# Patient Record
Sex: Male | Born: 1990 | Race: White | Hispanic: No | Marital: Single | Smoking: Current some day smoker
Health system: Southern US, Community
[De-identification: ages and names within clinical notes are randomized; demographics above are authoritative.]

## PROBLEM LIST (undated history)

## (undated) DIAGNOSIS — F1110 Opioid abuse, uncomplicated: Secondary | ICD-10-CM

## (undated) DIAGNOSIS — F419 Anxiety disorder, unspecified: Secondary | ICD-10-CM

## (undated) DIAGNOSIS — G43909 Migraine, unspecified, not intractable, without status migrainosus: Secondary | ICD-10-CM

## (undated) HISTORY — PX: OTHER SURGICAL HISTORY: SHX169

---
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
## 2014-01-20 ENCOUNTER — Emergency Department: Payer: Self-pay | Admitting: Emergency Medicine

## 2014-04-01 ENCOUNTER — Emergency Department: Payer: Self-pay | Admitting: Emergency Medicine

## 2014-04-01 LAB — BASIC METABOLIC PANEL
ANION GAP: 7 (ref 7–16)
BUN: 9 mg/dL (ref 7–18)
CREATININE: 0.79 mg/dL (ref 0.60–1.30)
Calcium, Total: 8.9 mg/dL (ref 8.5–10.1)
Chloride: 109 mmol/L — ABNORMAL HIGH (ref 98–107)
Co2: 28 mmol/L (ref 21–32)
EGFR (African American): >60
EGFR (Non-African Amer.): >60
Glucose: 90 mg/dL (ref 65–99)
Osmolality: 285 (ref 275–301)
Potassium: 4.3 mmol/L (ref 3.5–5.1)
Sodium: 144 mmol/L (ref 136–145)

## 2014-04-01 LAB — CBC
HCT: 39.9 % — AB (ref 40.0–52.0)
HGB: 13 g/dL (ref 13.0–18.0)
MCH: 25.8 pg — ABNORMAL LOW (ref 26.0–34.0)
MCHC: 32.5 g/dL (ref 32.0–36.0)
MCV: 79 fL — ABNORMAL LOW (ref 80–100)
PLATELETS: 164 10*3/uL (ref 150–440)
RBC: 5.02 10*6/uL (ref 4.40–5.90)
RDW: 17.8 % — ABNORMAL HIGH (ref 11.5–14.5)
WBC: 5.7 10*3/uL (ref 3.8–10.6)

## 2014-10-21 ENCOUNTER — Emergency Department: Payer: Self-pay | Admitting: Emergency Medicine

## 2015-01-29 ENCOUNTER — Emergency Department
Admission: EM | Admit: 2015-01-29 | Discharge: 2015-01-29 | Discharge status: Against Medical Advice | Payer: 59 | Attending: Student | Admitting: Student

## 2015-01-29 DIAGNOSIS — F1210 Cannabis abuse, uncomplicated: Secondary | ICD-10-CM | POA: Diagnosis not present

## 2015-01-29 DIAGNOSIS — F1110 Opioid abuse, uncomplicated: Secondary | ICD-10-CM

## 2015-01-29 DIAGNOSIS — Z720 Tobacco use: Secondary | ICD-10-CM | POA: Insufficient documentation

## 2015-01-29 DIAGNOSIS — F1310 Sedative, hypnotic or anxiolytic abuse, uncomplicated: Secondary | ICD-10-CM | POA: Diagnosis not present

## 2015-01-29 HISTORY — DX: Opioid abuse, uncomplicated: F11.10

## 2015-01-29 HISTORY — DX: Migraine, unspecified, not intractable, without status migrainosus: G43.909

## 2015-01-29 HISTORY — DX: Anxiety disorder, unspecified: F41.9

## 2015-01-29 LAB — CBC
HCT: 41.8 % (ref 40.0–52.0)
Hemoglobin: 13.8 g/dL (ref 13.0–18.0)
MCH: 25.5 pg — ABNORMAL LOW (ref 26.0–34.0)
MCHC: 32.9 g/dL (ref 32.0–36.0)
MCV: 77.6 fL — ABNORMAL LOW (ref 80.0–100.0)
Platelets: 200 10*3/uL (ref 150–440)
RBC: 5.39 MIL/uL (ref 4.40–5.90)
RDW: 16.2 % — ABNORMAL HIGH (ref 11.5–14.5)
WBC: 6.7 10*3/uL (ref 3.8–10.6)

## 2015-01-29 LAB — SALICYLATE LEVEL

## 2015-01-29 LAB — COMPREHENSIVE METABOLIC PANEL
ALBUMIN: 4.5 g/dL (ref 3.5–5.0)
ALK PHOS: 121 U/L (ref 38–126)
ALT: 22 U/L (ref 17–63)
ANION GAP: 8 (ref 5–15)
AST: 25 U/L (ref 15–41)
BUN: 7 mg/dL (ref 6–20)
CO2: 29 mmol/L (ref 22–32)
CREATININE: 0.85 mg/dL (ref 0.61–1.24)
Calcium: 9.5 mg/dL (ref 8.9–10.3)
Chloride: 105 mmol/L (ref 101–111)
GFR calc non Af Amer: >60 mL/min (ref >60)
GLUCOSE: 67 mg/dL (ref 65–99)
POTASSIUM: 3.7 mmol/L (ref 3.5–5.1)
Sodium: 142 mmol/L (ref 135–145)
Total Bilirubin: 0.9 mg/dL (ref 0.3–1.2)
Total Protein: 8 g/dL (ref 6.5–8.1)

## 2015-01-29 LAB — ACETAMINOPHEN LEVEL: Acetaminophen (Tylenol), Serum: <10 ug/mL — ABNORMAL LOW (ref 10–30)

## 2015-01-29 LAB — URINE DRUG SCREEN, QUALITATIVE (ARMC ONLY)
Amphetamines, Ur Screen: NOT DETECTED — AB
Barbiturates, Ur Screen: NOT DETECTED — AB
Benzodiazepine, Ur Scrn: POSITIVE — AB
CANNABINOID 50 NG, UR ~~LOC~~: POSITIVE — AB
COCAINE METABOLITE, UR ~~LOC~~: NOT DETECTED — AB
MDMA (ECSTASY) UR SCREEN: NOT DETECTED — AB
METHADONE SCREEN, URINE: NOT DETECTED — AB
Opiate, Ur Screen: NOT DETECTED — AB
PHENCYCLIDINE (PCP) UR S: NOT DETECTED — AB
Tricyclic, Ur Screen: NOT DETECTED — AB

## 2015-01-29 LAB — ETHANOL: Alcohol, Ethyl (B): <5 mg/dL (ref <5)

## 2015-01-29 NOTE — ED Notes (Signed)
Pt states that he is here voluntarily because he wants detox from "pain killers". Pt currenlty takes percocet, dilaudid, vicodin and promethazine with codeine daily. Pt states use x1 year; has gotten worse and use has been daily since February when his grandmother died. Denies SI or HI. Alert and oriented X4, cooperative, pt in NAD. History of detox at home for "a few days at a time".

## 2015-01-29 NOTE — ED Notes (Signed)
Pt states he wants help to stop using opiate/pain meds..states he uses everyday.states last used 2 days ago.

## 2015-01-29 NOTE — ED Notes (Signed)

## 2015-01-29 NOTE — ED Notes (Signed)
Pt dressed out in triage.

## 2015-01-29 NOTE — Discharge Instructions (Signed)
Chemical Dependency °Chemical dependency is an addiction to drugs or alcohol. It is characterized by the repeated behavior of seeking out and using drugs and alcohol despite harmful consequences to the health and safety of ones self and others.  °RISK FACTORS °There are certain situations or behaviors that increase a person's risk for chemical dependency. These include: °· A family history of chemical dependency. °· A history of mental health issues, including depression and anxiety. °· A home environment where drugs and alcohol are easily available to you. °· Drug or alcohol use at a young age. °SYMPTOMS  °The following symptoms can indicate chemical dependency: °· Inability to limit the use of drugs or alcohol. °· Nausea, sweating, shakiness, and anxiety that occurs when alcohol or drugs are not being used. °· An increase in amount of drugs or alcohol that is necessary to get drunk or high. °People who experience these symptoms can assess their use of drugs and alcohol by asking themselves the following questions: °· Have you been told by friends or family that they are worried about your use of alcohol or drugs? °· Do friends and family ever tell you about things you did while drinking alcohol or using drugs that you do not remember? °· Do you lie about using alcohol or drugs or about the amounts you use? °· Do you have difficulty completing daily tasks unless you use alcohol or drugs? °· Is the level of your work or school performance lower because of your drug or alcohol use? °· Do you get sick from using drugs or alcohol but keep using anyway? °· Do you feel uncomfortable in social situations unless you use alcohol or drugs? °· Do you use drugs or alcohol to help forget problems?  °An answer of yes to any of these questions may indicate chemical dependency. Professional evaluation is suggested. °Document Released: 09/05/2001 Document Revised: 12/04/2011 Document Reviewed: 11/17/2010 °ExitCare® Patient  Information ©2015 [employer]. This information is not intended to replace advice given to you by your health care provider. Make sure you discuss any questions you have with your health care provider. ° °Opioid Use Disorder °Opioid use disorder is a mental disorder. It is the continued nonmedical use of opioids in spite of risks to health and well-being. Misused opioids include the street drug heroin. They also include pain medicines such as morphine, hydrocodone, oxycodone, and fentanyl. Opioids are very addictive. People who misuse opioids get an exaggerated feeling of well-being. Opioid use disorder often disrupts activities at home, work, or school. It may cause mental or physical problems.  °A family history of opioid use disorder puts you at higher risk of it. People with opioid use disorder often misuse other drugs or have mental illness such as depression, posttraumatic stress disorder, or antisocial personality disorder. They also are at risk of suicide and death from overdose. °SIGNS AND SYMPTOMS  °Signs and symptoms of opioid use disorder include: °· Use of opioids in larger amounts or over a longer period than intended. °· Unsuccessful attempts to cut down or control opioid use. °· A lot of time spent obtaining, using, or recovering from the effects of opioids. °· A strong desire or urge to use opioids (craving). °· Continued use of opioids in spite of major problems at work, school, or home because of use. °· Continued use of opioids in spite of relationship problems because of use. °· Giving up or cutting down on important life activities because of opioid use. °· Use of opioids over and over   in situations when it is physically hazardous, such as driving a car.  Continued use of opioids in spite of a physical problem that is likely related to use. Physical problems can include:  Severe constipation.  Poor nutrition.  Infertility.  Tuberculosis.  Aspiration pneumonia.  Infections such  as human immunodeficiency virus (HIV) and hepatitis (from injecting opioids).  Continued use of opioids in spite of a mental problem that is likely related to use. Mental problems can include:  Depression.  Anxiety.  Hallucinations.  Sleep problems.  Loss of sexual function.  Need to use more and more opioids to get the same effect, or lessened effect over time with use of the same amount (tolerance).  Having withdrawal symptoms when opioid use is stopped, or using opioids to reduce or avoid withdrawal symptoms. Withdrawal symptoms include:  Depressed, anxious, or irritable mood.  Nausea, vomiting, diarrhea, or intestinal cramping.  Muscle aches or spasms.  Excessive tearing or runny nose.  Dilated pupils, sweating, or hairs standing on end.  Yawning.  Fever, raised blood pressure, or fast pulse.  Restlessness or trouble sleeping. This does not apply to people taking opioids for medical reasons only. DIAGNOSIS Opioid use disorder is diagnosed by your health care provider. You may be asked questions about your opioid use and and how it affects your life. A physical exam may be done. A drug screen may be ordered. You may be referred to a mental health professional. The diagnosis of opioid use disorder requires at least two symptoms within 12 months. The type of opioid use disorder you have depends on the number of signs and symptoms you have. The type may be:  Mild. Two or three signs and symptoms.   Moderate. Four or five signs and symptoms.   Severe. Six or more signs and symptoms. TREATMENT  Treatment is usually provided by mental health professionals with training in substance use disorders.The following options are available:  Detoxification.This is the first step in treatment for withdrawal. It is medically supervised withdrawal with the use of medicines. These medicines lessen withdrawal symptoms. They also raise the chance of becoming opioid free.  Counseling,  also known as talk therapy. Talk therapy addresses the reasons you use opioids. It also addresses ways to keep you from using again (relapse). The goals of talk therapy are to avoid relapse by:  Identifying and avoiding triggers for use.  Finding healthy ways to cope with stress.  Learning how to handle cravings.  Support groups. Support groups provide emotional support, advice, and guidance.  A medicine that blocks opioid receptors in your brain. This medicine can reduce opioid cravings that lead to relapse. This medicine also blocks the desired opioid effect when relapse occurs.  Opioids that are taken by mouth in place of the misused opioid (opioid maintenance treatment). These medicines satisfy cravings but are safer than commonly misused opioids. This often is the best option for people who continue to relapse with other treatments. HOME CARE INSTRUCTIONS   Take medicines only as directed by your health care provider.  Check with your health care provider before starting new medicines.  Keep all follow-up visits as directed by your health care provider. SEEK MEDICAL CARE IF:  You are not able to take your medicines as directed.  Your symptoms get worse. SEEK IMMEDIATE MEDICAL CARE IF:  You have serious thoughts about hurting yourself or others.  You may have taken an overdose of opioids. FOR MORE INFORMATION  National Institute on Drug Abuse: www.drugabuse.gov  Substance Abuse and Mental Health Services Administration: [employer].ptwww.samhsa.gov Document Released: 07/09/2007 Document Revised: 01/26/2014 Document Reviewed: 09/24/2013 [facility] Patient Information 2015 [place], [place]. This information is not intended to replace advice given to you by your health care provider. Make sure you discuss any questions you have with your health care provider.

## 2015-01-29 NOTE — ED Notes (Signed)
BEHAVIORAL HEALTH ROUNDING Patient sleeping: No. Patient alert and oriented: yes Behavior appropriate: Yes.  ; If no, describe:  Nutrition and fluids offered: Yes  Toileting and hygiene offered: Yes  Sitter present: no Law enforcement present: Yes  

## 2015-01-29 NOTE — ED Provider Notes (Signed)
[facility] Emergency Department Provider Note    ____________________________________________  Time seen: ----------------------------------------- 4:08 PM on 01/29/2015 -----------------------------------------    I have reviewed the triage vital signs and the nursing notes.   HISTORY  Chief Complaint Addiction Problem       HPI Hazle CocaJacob K Beste is a 24 y.o. male chin anxiety and migraines presents for evaluation for opiate detox. Location generalized. Duration one year, symptoms worsened approximately 2 months ago when his grandmother died. Timing waxing and waning. Current severity 10 out of 10. Patient abuses Percocet, Dilaudid, Vicodin, promethazine with codeine. No acute medical complaints and he has otherwise been in his usual state of health. No recent changes in medications, no recent surgeries.     Past Medical History  Diagnosis Date  . Anxiety   . Migraines   . Opiate abuse, continuous     There are no active problems to display for this patient.   Past Surgical History  Procedure Laterality Date  . Denies      No current outpatient prescriptions on file.  Allergies Review of patient's allergies indicates no known allergies.  No family history on file.  Social History History  Substance Use Topics  . Smoking status: Current Some Day Smoker    Types: Cigarettes  . Smokeless tobacco: Never Used  . Alcohol Use: No    Review of Systems  Constitutional: Negative for fever. Eyes: Negative for visual changes. ENT: Negative for sore throat. Cardiovascular: Negative for chest pain. Respiratory: Negative for shortness of breath. Gastrointestinal: Negative for abdominal pain, vomiting and diarrhea. Genitourinary: Negative for dysuria. Musculoskeletal: Negative for back pain. Skin: Negative for rash. Neurological: Negative for headaches, focal weakness or numbness. Psychiatric:Negative for suicidal ideation, homicidal  ideation, auditory or visual hallucinations  10-point ROS otherwise negative.  ____________________________________________   PHYSICAL EXAM:  VITAL SIGNS: ED Triage Vitals  Enc Vitals Group     BP 01/29/15 1505 113/84 mmHg     Pulse Rate 01/29/15 1505 78     Resp 01/29/15 1505 15     Temp 01/29/15 1505 97.8 F (36.6 C)     Temp Source 01/29/15 1505 Oral     SpO2 01/29/15 1505 99 %     Weight 01/29/15 1505 140 lb (63.504 kg)     Height 01/29/15 1505 5\' 7"  (1.702 m)     Head Cir --      Peak Flow --      Pain Score --      Pain Loc --      Pain Edu? --      Excl. in GC? --      Constitutional: Alert and oriented. Well appearing and in no distress. Eyes: Conjunctivae are normal. PERRL. Normal extraocular movements. ENT   Head: Normocephalic and atraumatic.   Nose: No congestion/rhinnorhea.   Mouth/Throat: Mucous membranes are moist.   Neck: No stridor. Hematological/Lymphatic/Immunilogical: No cervical lymphadenopathy. Cardiovascular: Normal rate, regular rhythm. Normal and symmetric distal pulses are present in all extremities. No murmurs, rubs, or gallops. Respiratory: Normal respiratory effort without tachypnea nor retractions. Breath sounds are clear and equal bilaterally. No wheezes/rales/rhonchi. Gastrointestinal: Soft and nontender. No distention. No abdominal bruits. There is no CVA tenderness. Genitourinary: deferred Musculoskeletal: Nontender with normal range of motion in all extremities. No joint effusions.  No lower extremity tenderness nor edema. Neurologic:  Normal speech and language. No gross focal neurologic deficits are appreciated. Speech is normal. No gait instability. Skin:  Skin is warm, dry and  intact. No rash noted. Psychiatric: Mood and affect are normal. Speech and behavior are normal. Patient exhibits appropriate insight and judgment.  ____________________________________________    LABS (pertinent positives/negatives)  Pending  at time of discharge. Reviewed by me after he left and were generally unremarkable.  ____________________________________________   EKG  None  ____________________________________________    RADIOLOGY  None  ____________________________________________   PROCEDURES  Procedure(s) performed: None  Critical Care performed: No  ____________________________________________   INITIAL IMPRESSION / ASSESSMENT AND PLAN / ED COURSE  Pertinent labs & imaging results that were available during my care of the patient were reviewed by me and considered in my medical decision making (see chart for details).  Hazle CocaJacob K Goetzinger is a 24 y.o. male chin anxiety and migraines presents for evaluation for opiate detox. And on exam. No acute medical complaints. Vital signs stable. Plan for screening labs and behavioral consult.   ----------------------------------------- 4:38 PM on 01/29/2015 -----------------------------------------  Patient reports that he needs to leave immediately. He reports that if he doesn't make it to work on time, he will lose his job today. There is no reason to involuntarily commit him/retain him here in the emergency department. Encouraged him to stay in the ER because his lab work is pending and there could be a critical electrolyte abnormality. He refuses to stay, is leaving  AGAINST MEDICAL ADVICE. I will provide discharge papers. He appears well, no evidence of active/severe withdrawal.  ____________________________________________   FINAL CLINICAL IMPRESSION(S) / ED DIAGNOSES  Final diagnoses:  Opiate abuse, continuous     Gayla DossEryka A Cortlan Dolin, MD 01/29/15 2317

## 2015-01-29 NOTE — ED Notes (Signed)
Pt encouraged to stay for further exam; pt refuses. EDP informed. AMA form signed. Pt left to go to work. Alert and oriented X4, cooperative.

## 2015-01-29 NOTE — ED Notes (Signed)
Pt states that he needs to leave; cannot miss work at 5

## 2015-01-29 NOTE — ED Notes (Signed)
MD at bedside. 

## 2015-02-05 DIAGNOSIS — Z720 Tobacco use: Secondary | ICD-10-CM | POA: Diagnosis not present

## 2015-02-05 DIAGNOSIS — Y92481 Parking lot as the place of occurrence of the external cause: Secondary | ICD-10-CM | POA: Diagnosis not present

## 2015-02-05 DIAGNOSIS — Y9389 Activity, other specified: Secondary | ICD-10-CM | POA: Insufficient documentation

## 2015-02-05 DIAGNOSIS — T1592XA Foreign body on external eye, part unspecified, left eye, initial encounter: Secondary | ICD-10-CM | POA: Insufficient documentation

## 2015-02-05 DIAGNOSIS — Y990 Civilian activity done for income or pay: Secondary | ICD-10-CM | POA: Diagnosis not present

## 2015-02-05 DIAGNOSIS — X58XXXA Exposure to other specified factors, initial encounter: Secondary | ICD-10-CM | POA: Insufficient documentation

## 2015-02-05 DIAGNOSIS — S0592XA Unspecified injury of left eye and orbit, initial encounter: Secondary | ICD-10-CM | POA: Diagnosis present

## 2015-02-05 NOTE — ED Notes (Signed)
Pt states since he put contact in left eye has had pain but states it feels like its stuck under top eye lid.

## 2015-02-06 ENCOUNTER — Emergency Department
Admission: EM | Admit: 2015-02-06 | Discharge: 2015-02-06 | Disposition: A | Discharge status: Home / Self Care | Payer: 59 | Attending: Emergency Medicine | Admitting: Emergency Medicine

## 2015-02-06 DIAGNOSIS — T1592XA Foreign body on external eye, part unspecified, left eye, initial encounter: Secondary | ICD-10-CM

## 2015-02-06 MED ORDER — ERYTHROMYCIN 5 MG/GM OP OINT
TOPICAL_OINTMENT | OPHTHALMIC | Status: AC
  Administered 2015-02-06: 1 via OPHTHALMIC
  Filled 2015-02-06: qty 1

## 2015-02-06 MED ORDER — ERYTHROMYCIN 5 MG/GM OP OINT
TOPICAL_OINTMENT | Freq: Once | OPHTHALMIC | Status: AC
  Administered 2015-02-06: 1 via OPHTHALMIC

## 2015-02-06 NOTE — ED Provider Notes (Signed)
[facility] Provider Note  ____________________________________________  Time seen: Approximately 1:40 AM  I have reviewed the triage vital signs and the nursing notes.   HISTORY  Chief Complaint Eye Pain    HPI Jeremy Owen is a 24 y.o. male comes in today with foreign body in his left eye. The patient reports that he was sitting in the parking lot at his job putting in his contact when it got stuck in the upper portion of his eye. The patient reports that he attempted to pull the contact out but was unsuccessful. The patient reports that he told his job he would come into the hospital and have them adjusted in the right back. The patient reports that while he was in the waiting room he continually went to the bathroom as his eye was very irritated and at one point his eye felt improved. He reports that he did not see the contact himself but feels that it may have fallen out of his eye. The patient denies any blurry vision any significant pain any drainage or erythema. The patient reports that his eye feels funny but not as it did previously. He reports that he did put some "stuff" in his eye for lubrication.   Past Medical History  Diagnosis Date  . Anxiety   . Migraines   . Opiate abuse, continuous     There are no active problems to display for this patient.   Past Surgical History  Procedure Laterality Date  . Denies      No current outpatient prescriptions on file.  Allergies Review of patient's allergies indicates no known allergies.  No family history on file.  Social History History  Substance Use Topics  . Smoking status: Current Some Day Smoker    Types: Cigarettes  . Smokeless tobacco: Never Used  . Alcohol Use: No    Review of Systems Constitutional: No fever/chills Eyes: No visual changes. ENT: No sore throat. Cardiovascular: Denies chest pain. Respiratory: Denies shortness of  breath. Gastrointestinal: No abdominal pain.  No nausea, no vomiting.   Genitourinary: Negative for dysuria. Musculoskeletal: Negative for back pain. Skin: Negative for rash. Neurological: Negative for headaches, 10-point ROS otherwise negative.  ____________________________________________   PHYSICAL EXAM:  VITAL SIGNS: ED Triage Vitals  Enc Vitals Group     BP 02/05/15 2203 126/83 mmHg     Pulse Rate 02/05/15 2203 86     Resp 02/05/15 2203 18     Temp 02/05/15 2203 97.8 F (36.6 C)     Temp Source 02/05/15 2203 Oral     SpO2 02/05/15 2203 100 %     Weight 02/05/15 2203 135 lb (61.236 kg)     Height 02/05/15 2203 5\' 8"  (1.727 m)     Head Cir --      Peak Flow --      Pain Score 02/05/15 2204 6     Pain Loc --      Pain Edu? --      Excl. in GC? --     Constitutional: Alert and oriented. Well appearing and in no acute distress. Eyes: Conjunctivae are normal. PERRL. EOMI. contact not visualized in left eye Head: Atraumatic. Nose: No congestion/rhinnorhea. Mouth/Throat: Mucous membranes are moist.  Oropharynx non-erythematous. Cardiovascular: Normal rate, regular rhythm. Grossly normal heart sounds.  Good peripheral circulation. Respiratory: Normal respiratory effort.  No retractions. Lungs CTAB. Gastrointestinal: Soft and nontender. No distention.  Genitourinary: Deferred Musculoskeletal: No lower extremity tenderness nor edema.  Neurologic:  Normal speech and language.  Skin:  Skin is warm, dry and intact. Psychiatric: Mood and affect are normal. Speech and behavior are normal.  ____________________________________________   LABS (all labs ordered are listed, but only abnormal results are displayed)  Labs Reviewed - No data to display ____________________________________________  EKG  None ____________________________________________  RADIOLOGY  None ____________________________________________   PROCEDURES  Procedure(s) performed: None  Critical  Care performed: No  ____________________________________________   INITIAL IMPRESSION / ASSESSMENT AND PLAN / ED COURSE  Pertinent labs & imaging results that were available during my care of the patient were reviewed by me and considered in my medical decision making (see chart for details).  The patient is a 24 year old male who comes in with a foreign body in his left eye. The foreign body as the patient's contact lens. Upon evaluation the contact lens is not visualized. The patient does have some mild erythema of his sclera. It appears as though the patient's eye is irritated from the manipulation attempting to remove the contact. It appears as though the patient may have removed the contact on his own while continually checking in the bathroom. I will give the patient some erythromycin eye ointment and discharge him to follow-up with ophthalmology. The patient understands the plan and agrees with the follow-up as previously stated ____________________________________________   FINAL CLINICAL IMPRESSION(S) / ED DIAGNOSES  Final diagnoses:  Left eye foreign body       Rebecka ApleyAllison P Joon Pohle, MD 02/06/15 0210

## 2015-02-06 NOTE — ED Notes (Signed)
Pt reports he thought contact was stuck in his left upper eyelid but now states he feels it has gone back to normal.  Pt reports still feeling the eye is irritated.  Pt states able to see out of eye though blurry which is baseline.  Pt NAD at this time.

## 2015-02-06 NOTE — Discharge Instructions (Signed)
Eye, Foreign Body The term foreign body refers to any object near, on the surface of or in the eye that should not be there. A foreign body may be a small speck of dirt or dust, a hair or eyelash, a splinter or any object. CAUSES  Foreign bodies can get in the eye by:  Flying pieces of something that was broken or destroyed (debris).  A sudden injury (trauma) to the eye. SYMPTOMS  Symptoms depend on what the foreign body is and where it is in the eye. The most common locations are:  On the inner surface of the upper or lower eyelids or on the covering of the white part of the eye (conjunctiva). Symptoms in this location are:  Irritating and painful, especially when blinking.  Feeling like something is in the eye.  On the surface of the clear covering on the front of the eye (cornea). A corneal foreign body has symptoms that:  Are painful and irritating since the cornea is very sensitive.  Form small "rust rings" around a metallic foreign body. Metallic foreign bodies stick more firmly to the surface of the cornea.  Inside the eyeball. Infection can happen fast and can be hard to treat with antibiotics. This is an extremely dangerous situation. Foreign bodies inside the eye can threaten vision. A person may even loose their eye. Foreign bodies inside the eye may cause:  Great pain.  Immediate loss of vision. DIAGNOSIS  Foreign bodies are found during an exam by an eye specialist. Those that are on the eyelids, conjunctiva or cornea are usually (but not always) easily found. When a foreign body is inside the eyeball, a cataract may form almost right away. This makes it hard for an ophthalmologist to find the foreign body. Special tests may be needed, including ultrasound testing, X-rays and CT scans. TREATMENT   Foreign bodies that are on the eyelids, conjunctiva or cornea are often removed easily and painlessly.  If the foreign body has caused a scratch or abrasion of the cornea,  antibiotic drops, ointments and/or a tight patch called a "pressure patch" may be needed. Follow-up exams will be needed for several days until the abrasion heals.  Surgery is needed right away if the foreign body is inside the eyeball. This is a medical emergency. An antibiotic therapy will likely be given to stop an infection. HOME CARE INSTRUCTIONS  The use of eye patches is not universal. Their use varies from state to state and from caregiver to caregiver. If an eye patch was applied:  Keep the eye patch on for as long as directed by your caregiver until the follow-up appointment.  Do not remove the patch to put in medications unless instructed to do so. When replacing the patch, retape it as it was before. Follow the same procedure if the patch becomes loose.  WARNING: Do not drive or operate machinery while the eye is patched. The ability to judge distances will be impaired.  Only take over-the-counter or prescription medicines for pain, discomfort or fever as directed by the caregiver. If no eye patch was applied:  Keep the eye closed as much as possible. Do not rub the eye.  Wear dark glasses as needed to protect the eyes from bright light.  Do not wear contact lenses until the eye feels normal again, or as instructed.  Wear protective eye covering if there is a risk of eye injury. This is important when working with high speed tools.  Only take over-the-counter or   prescription medicines for pain, discomfort or fever as directed by the caregiver. SEEK IMMEDIATE MEDICAL CARE IF:   Pain increases in the eye or the vision changes.  You or your child has problems with the eye patch.  The injury to the eye appears to be getting larger.  There is discharge from the injured eye.  Swelling and/or soreness (inflammation) develops around the affected eye.  You or your child has an oral temperature above 102 F (38.9 C), not controlled by medicine.  Your baby is older than 3  months with a rectal temperature of 102 F (38.9 C) or higher.  Your baby is 3 months old or younger with a rectal temperature of 100.4 F (38 C) or higher. MAKE SURE YOU:   Understand these instructions.  Will watch your condition.  Will get help right away if you are not doing well or get worse. Document Released: 09/11/2005 Document Revised: 12/04/2011 Document Reviewed: 02/06/2013 ExitCare Patient Information 2015 [employer]. This information is not intended to replace advice given to you by your health care provider. Make sure you discuss any questions you have with your health care provider.  

## 2015-07-06 ENCOUNTER — Encounter (HOSPITAL_COMMUNITY): Payer: Self-pay | Admitting: *Deleted

## 2015-07-06 ENCOUNTER — Emergency Department (HOSPITAL_COMMUNITY)
Admission: EM | Admit: 2015-07-06 | Discharge: 2015-07-06 | Discharge status: Against Medical Advice | Payer: Commercial Managed Care - HMO | Attending: Emergency Medicine | Admitting: Emergency Medicine

## 2015-07-06 ENCOUNTER — Emergency Department (HOSPITAL_COMMUNITY): Payer: Commercial Managed Care - HMO

## 2015-07-06 DIAGNOSIS — S6991XA Unspecified injury of right wrist, hand and finger(s), initial encounter: Secondary | ICD-10-CM | POA: Diagnosis not present

## 2015-07-06 DIAGNOSIS — Y9389 Activity, other specified: Secondary | ICD-10-CM | POA: Diagnosis not present

## 2015-07-06 DIAGNOSIS — F419 Anxiety disorder, unspecified: Secondary | ICD-10-CM | POA: Diagnosis not present

## 2015-07-06 DIAGNOSIS — M25559 Pain in unspecified hip: Secondary | ICD-10-CM

## 2015-07-06 DIAGNOSIS — S79911A Unspecified injury of right hip, initial encounter: Secondary | ICD-10-CM | POA: Diagnosis not present

## 2015-07-06 DIAGNOSIS — Y998 Other external cause status: Secondary | ICD-10-CM | POA: Diagnosis not present

## 2015-07-06 DIAGNOSIS — Y92810 Car as the place of occurrence of the external cause: Secondary | ICD-10-CM | POA: Insufficient documentation

## 2015-07-06 DIAGNOSIS — Z720 Tobacco use: Secondary | ICD-10-CM | POA: Diagnosis not present

## 2015-07-06 DIAGNOSIS — Z8679 Personal history of other diseases of the circulatory system: Secondary | ICD-10-CM | POA: Diagnosis not present

## 2015-07-06 MED ORDER — ACETAMINOPHEN 325 MG PO TABS
650.0000 mg | ORAL_TABLET | Freq: Once | ORAL | Status: DC
  Filled 2015-07-06: qty 2
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
## 2015-07-06 NOTE — ED Notes (Signed)
Pt refused tylenol stated he wanted narcotics, because he has to work, pt advised that he can't work and take narcotics and per ED MD because of his substance abuse that he is getting a non narcotic medication to help with pain and his sobriety.  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   [employer] and security in lobby speaking with pt after he left AMA because he couldn't get narcotis
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
## 2016-10-04 ENCOUNTER — Emergency Department (HOSPITAL_COMMUNITY)
Admission: EM | Admit: 2016-10-04 | Discharge: 2016-10-04 | Disposition: A | Discharge status: Home / Self Care | Payer: Commercial Managed Care - HMO | Attending: Physician Assistant | Admitting: Physician Assistant

## 2016-10-04 ENCOUNTER — Encounter (HOSPITAL_COMMUNITY): Payer: Self-pay | Admitting: Emergency Medicine

## 2016-10-04 DIAGNOSIS — Z765 Malingerer [conscious simulation]: Secondary | ICD-10-CM

## 2016-10-04 DIAGNOSIS — K0889 Other specified disorders of teeth and supporting structures: Secondary | ICD-10-CM

## 2016-10-04 DIAGNOSIS — F17210 Nicotine dependence, cigarettes, uncomplicated: Secondary | ICD-10-CM | POA: Diagnosis not present

## 2016-10-04 MED ORDER — PENICILLIN V POTASSIUM 500 MG PO TABS: 500.0000 mg | ORAL_TABLET | Freq: Four times a day (QID) | ORAL | 0 refills | Status: AC

## 2016-10-04 NOTE — Discharge Instructions (Signed)
Please read attached information. If you experience any new or worsening signs or symptoms please return to the emergency room for evaluation. Please follow-up with your primary care provider or specialist as discussed.  °

## 2016-10-04 NOTE — ED Notes (Signed)
Went to discharge patient and patient was not in room. His patient labels were still in the room at bedside. Called for patient in lobby but no answer. Spoke with Trey PaulaJeff, [place] about patient departing before discharging patient.

## 2016-10-04 NOTE — ED Provider Notes (Signed)
[facility]EM[facility]ENC[facility] Provider Note   CSN: [id number] Arrival date & time: 10/04/16  1354  By signing my name below, I, Jeremy Owen, attest that this documentation has been prepared under the direction and in the presence of [employer].  Electronically Signed: Vista Owen, ED Scribe. 10/04/16. 3:53 PM.  History   Chief Complaint Chief Complaint  Patient presents with  . Dental Pain    HPI HPI Comments: Jeremy Owen is a 26 y.o. male who presents to the Emergency Department complaining of worsening left sided dental pain that started that started several months ago. He states that his has two decayed teeth on left upper and left lower. He reports worst pain to his jaw and some swelling noted Pt states that he is having them pulled next week by a dentist. He has taken ibuprofen and tylenol with no relief of symptoms. No ear pain.  The history is provided by the patient. No language interpreter was used.   Past Medical History:  Diagnosis Date  . Anxiety   . Migraines   . Opiate abuse, continuous    There are no active problems to display for this patient.  Past Surgical History:  Procedure Laterality Date  . denies       Home Medications    Prior to Admission medications   Medication Sig Start Date End Date Taking? Authorizing Provider  LORazepam (ATIVAN) 0.5 MG tablet Take 0.5 mg by mouth 2 (two) times daily as needed for anxiety.  05/06/15   Historical Provider, MD  penicillin v potassium (VEETID) 500 MG tablet Take 1 tablet (500 mg total) by mouth 4 (four) times daily. 10/04/16 10/11/16  Eyvonne Mechanic, PA-C   Family History No family history on file.  Social History Social History  Substance Use Topics  . Smoking status: Current Some Day Smoker    Types: Cigarettes  . Smokeless tobacco: Never Used  . Alcohol use No   Allergies   Patient has no known allergies.  Review of Systems Review of Systems A complete 10 system review of systems was obtained  and all systems are negative except as noted in the HPI and PMH.   Physical Exam Updated Vital Signs BP 134/90 (BP Location: Left Arm)   Pulse 108   Temp 97.5 F (36.4 C)   Ht 5\' 8"  (1.727 m)   SpO2 100%   Physical Exam  Constitutional: He is oriented to person, place, and time. He appears well-developed and well-nourished. No distress.  HENT:  Head: Normocephalic and atraumatic.  Mouth/Throat: Uvula is midline, oropharynx is clear and moist and mucous membranes are normal. No oropharyngeal exudate, posterior oropharyngeal edema, posterior oropharyngeal erythema or tonsillar abscesses.  External exam shows no asymmetry of the jaw line or face, no signs of obvious swelling, edema, infection. Full active range of motion of the jaw. Neck is supple with full active range of motion, no tenderness to palpation of the soft tissues  Gumline palpated no obvious signs of infection including warmth, redness, abscess, tenderness. Posterior oropharynx clear with no signs of infection, uvula is midline and rises with phonation, tonsils present and normal in size, symmetrical bilateral, tongue is normal soft touch with full active range of motion, floor mouth is soft nontender.  Eyes: Conjunctivae are normal. Pupils are equal, round, and reactive to light. Right eye exhibits no discharge. Left eye exhibits no discharge.  Neck: Normal range of motion. Neck supple. No JVD present. No tracheal deviation present. No thyromegaly present.  Pulmonary/Chest: Effort normal. No stridor.  Lymphadenopathy:    He has no cervical adenopathy.  Neurological: He is alert and oriented to person, place, and time.  Skin: Skin is warm and dry. No rash noted. He is not diaphoretic. No erythema. No pallor.  Psychiatric: He has a normal mood and affect. His behavior is normal. Judgment and thought content normal.  Nursing note and vitals reviewed.  ED Treatments / Results  DIAGNOSTIC STUDIES: Oxygen Saturation is 100% on  RA, normal by my interpretation.  COORDINATION OF CARE: 3:53 PM-Discussed treatment plan with pt at bedside and pt agreed to plan.   Labs (all labs ordered are listed, but only abnormal results are displayed) Labs Reviewed - No data to display  EKG  EKG Interpretation None       Radiology No results found.  Procedures Procedures (including critical care time)  Medications Ordered in ED Medications - No data to display   Initial Impression / Assessment and Plan / ED Course  I have reviewed the triage vital signs and the nursing notes.  Pertinent labs & imaging results that were available during my care of the patient were reviewed by me and considered in my medical decision making (see chart for details).  Clinical Course     Labs:  Imaging:  Consults:  Therapeutics:  Discharge Meds:   Assessment/Plan:   26 year old male presents today with uncompensated dental pain. He has no signs of infection or trauma. The patient was explained that we do not prescribe narcotics for dental pain, the question why he was not able to receive any. I informed him he needs to follow up with his dentist for reevaluation. Patient has a history of opioid abuse. Today's presentation is consistent with drug-seeking behavior.   Final Clinical Impressions(s) / ED Diagnoses   Final diagnoses:  Pain, dental  Drug-seeking behavior    New Prescriptions Discharge Medication List as of 10/04/2016  4:06 PM    START taking these medications   Details  penicillin v potassium (VEETID) 500 MG tablet Take 1 tablet (500 mg total) by mouth 4 (four) times daily., Starting Wed 10/04/2016, Until Wed 10/11/2016, Print      I personally performed the services described in this documentation, which was scribed in my presence. The recorded information has been reviewed and is accurate.   Eyvonne MechanicJeffrey Byron Tipping, PA-C 10/04/16 1658    Courteney Lyn Mackuen, MD 10/05/16 1200

## 2016-10-04 NOTE — ED Triage Notes (Signed)
Patient reports having dental pain due to two bad teeth on left side, one upper and one lower, patient states having them pulled next week but pain is gotten worse in the past few days. Patient c/o jaw pain now.

## 2017-01-21 ENCOUNTER — Encounter (HOSPITAL_COMMUNITY): Payer: Self-pay | Admitting: Emergency Medicine

## 2017-01-21 ENCOUNTER — Ambulatory Visit (HOSPITAL_COMMUNITY)
Admission: EM | Admit: 2017-01-21 | Discharge: 2017-01-21 | Disposition: A | Discharge status: Home / Self Care | Payer: Commercial Managed Care - HMO | Attending: Internal Medicine | Admitting: Internal Medicine

## 2017-01-21 DIAGNOSIS — R0981 Nasal congestion: Secondary | ICD-10-CM | POA: Diagnosis not present

## 2017-01-21 DIAGNOSIS — R69 Illness, unspecified: Secondary | ICD-10-CM

## 2017-01-21 DIAGNOSIS — R05 Cough: Secondary | ICD-10-CM

## 2017-01-21 DIAGNOSIS — J111 Influenza due to unidentified influenza virus with other respiratory manifestations: Secondary | ICD-10-CM

## 2017-01-21 MED ORDER — PREDNISONE 50 MG PO TABS: 50.0000 mg | ORAL_TABLET | Freq: Every day | ORAL | 0 refills | Status: DC

## 2017-01-21 MED ORDER — BENZONATATE 200 MG PO CAPS: 200.0000 mg | ORAL_CAPSULE | Freq: Three times a day (TID) | ORAL | 1 refills | Status: DC | PRN

## 2017-01-21 MED ORDER — OSELTAMIVIR PHOSPHATE 75 MG PO CAPS: 75.0000 mg | ORAL_CAPSULE | Freq: Two times a day (BID) | ORAL | 0 refills | Status: DC

## 2017-01-21 MED ORDER — ALBUTEROL SULFATE HFA 108 (90 BASE) MCG/ACT IN AERS: 1.0000 | INHALATION_SPRAY | Freq: Four times a day (QID) | RESPIRATORY_TRACT | 0 refills | Status: DC | PRN

## 2017-01-21 NOTE — ED Triage Notes (Signed)
The patient presented to the [facility] with a complaint of a cough with head and chest congestion x 2 days.
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
## 2017-01-21 NOTE — Discharge Instructions (Addendum)
Anticipate gradual improvement over the next several days.  Recheck for increasing phlegm production, new fever >100.5, or if not starting to improve in a few days.  Prescriptions were sent to the [facility] on [address].

## 2017-03-04 ENCOUNTER — Emergency Department (HOSPITAL_COMMUNITY)
Admission: EM | Admit: 2017-03-04 | Discharge: 2017-03-04 | Disposition: A | Discharge status: Home / Self Care | Payer: Commercial Managed Care - HMO | Attending: Emergency Medicine | Admitting: Emergency Medicine

## 2017-03-04 ENCOUNTER — Encounter (HOSPITAL_COMMUNITY): Payer: Self-pay | Admitting: Emergency Medicine

## 2017-03-04 ENCOUNTER — Emergency Department (HOSPITAL_COMMUNITY): Payer: Commercial Managed Care - HMO

## 2017-03-04 DIAGNOSIS — Y990 Civilian activity done for income or pay: Secondary | ICD-10-CM | POA: Insufficient documentation

## 2017-03-04 DIAGNOSIS — F17210 Nicotine dependence, cigarettes, uncomplicated: Secondary | ICD-10-CM | POA: Diagnosis not present

## 2017-03-04 DIAGNOSIS — Y929 Unspecified place or not applicable: Secondary | ICD-10-CM | POA: Insufficient documentation

## 2017-03-04 DIAGNOSIS — S6991XA Unspecified injury of right wrist, hand and finger(s), initial encounter: Secondary | ICD-10-CM | POA: Diagnosis present

## 2017-03-04 DIAGNOSIS — W230XXA Caught, crushed, jammed, or pinched between moving objects, initial encounter: Secondary | ICD-10-CM | POA: Diagnosis not present

## 2017-03-04 DIAGNOSIS — Z79899 Other long term (current) drug therapy: Secondary | ICD-10-CM | POA: Diagnosis not present

## 2017-03-04 DIAGNOSIS — Y939 Activity, unspecified: Secondary | ICD-10-CM | POA: Insufficient documentation

## 2017-03-04 NOTE — Discharge Instructions (Signed)
Alternate 600 mg of ibuprofen and 500 mg of Tylenol every 3 hours for pain. Apply ice or heat to the affected area for comfort. Follow-up with primary care or hand surgery for reevaluation if symptoms persist. Return to the ED if any concerning signs or symptoms develop.
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
## 2017-03-04 NOTE — ED Provider Notes (Signed)
[facility]-EMERGENCY DEPT Provider Note    By signing my name below, I, Earmon Phoenix, attest that this documentation has been prepared under the direction and in the presence of [facility], PA-C. Electronically Signed: Earmon Phoenix, ED Scribe. 03/04/17. 1:55 PM.   History   Chief Complaint Chief Complaint  Patient presents with  . Hand Injury   The history is provided by the patient and medical records. No language interpreter was used.    AXXEL GUDE is a 26 y.o. male with PMHx of anxiety, migraines and opiate abuse who presents to the Emergency Department complaining of a right hand injury that occurred PTA while at work. He reports associated swelling and severe throbbing pain. 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 He has taken Ibuprofen 800 mg for pain with no significant relief. Touching the area or attempting to flex the fingers increase his pain. He denies alleviating factors. He denies fever, chills, numbness, tingling or weakness of the right hand or arm, bruising, or wounds. He does not have a PCP.    Past Medical History:  Diagnosis Date  . Anxiety   . Migraines   . Opiate abuse, continuous     There are no active problems to display for this patient.   Past Surgical History:  Procedure Laterality Date  . denies         Home Medications    Prior to Admission medications   Medication Sig Start Date End Date Taking? Authorizing Provider  albuterol (PROVENTIL HFA;VENTOLIN HFA) 108 (90 Base) MCG/ACT inhaler Inhale 1-2 puffs into the lungs every 6 (six) hours as needed for wheezing or shortness of breath. 01/21/17   Eustace Moore, MD  benzonatate (TESSALON) 200 MG capsule Take 1 capsule (200 mg total) by mouth 3 (three) times daily as needed for cough. 01/21/17   Eustace Moore, MD  oseltamivir (TAMIFLU) 75 MG capsule Take 1 capsule (75 mg total) by mouth every 12 (twelve) hours. 01/21/17   Eustace Moore, MD  predniSONE  (DELTASONE) 50 MG tablet Take 1 tablet (50 mg total) by mouth daily. 01/21/17   Eustace Moore, MD    Family History No family history on file.  Social History Social History  Substance Use Topics  . Smoking status: Current Some Day Smoker    Types: Cigarettes  . Smokeless tobacco: Never Used  . Alcohol use No     Allergies   Patient has no known allergies.   Review of Systems Review of Systems  Constitutional: Negative for chills and fever.  Musculoskeletal: Positive for arthralgias and joint swelling.  Skin: Negative for color change and wound.  Neurological: Negative for weakness and numbness.  All other systems reviewed and are negative.    Physical Exam Updated Vital Signs BP (!) 144/99 (BP Location: Right Arm)   Pulse 77   Temp 98.2 F (36.8 C) (Oral)   Resp 18   SpO2 99%   Physical Exam  Constitutional: He is oriented to person, place, and time. He appears well-developed and well-nourished.  HENT:  Head: Normocephalic and atraumatic.  Eyes: Conjunctivae are normal. Right eye exhibits no discharge. Left eye exhibits no discharge.  Neck: No JVD present. No tracheal deviation present.  Cardiovascular: Normal rate.   Radial pulses 2+ bilaterally. Cap refill less than 2 seconds.  Pulmonary/Chest: Effort normal.  Abdominal: He exhibits no distension.  Musculoskeletal: He exhibits tenderness. He exhibits no deformity.  Right hand with swelling  and erythema overlying the dorsum of the 3rd, 4th and 5th MCP joints. Swelling extends into digits, primarily the 3rd. Maximally TTP overlying the 5th metacarpal head. No deformity or crepitus. No snuff box tenderness. Full ROM of the wrist and digits, however pain elicited with flexion and extension of the digits. 5/5 strength of wrist and digits.  Neurological: He is alert and oriented to person, place, and time.  Fluent speech, no facial droop. Sensation to light touch intact to right hand. Good grip strength.  Skin: Skin  is warm and dry. Capillary refill takes less than 2 seconds.  No lacerations, ecchymosis, or other signs of trauma noted  Psychiatric: He has a normal mood and affect. His behavior is normal.  Nursing note and vitals reviewed.    ED Treatments / Results  DIAGNOSTIC STUDIES: Oxygen Saturation is 99% on RA, normal by my interpretation.   COORDINATION OF CARE: 1:48 PM- Will buddy tape fingers and provide work note. Recommended alternating OTC Ibuprofen and Tylenol. Will give resources for pt to establish care with a PCP for follow up. Pt verbalizes understanding and agrees to plan.  Medications - No data to display  Labs (all labs ordered are listed, but only abnormal results are displayed) Labs Reviewed - No data to display  EKG  EKG Interpretation None       Radiology Dg Hand Complete Right  Result Date: 03/04/2017 CLINICAL DATA:  Acute right hand pain following crush injury. Initial encounter. EXAM: RIGHT HAND - COMPLETE 3+ VIEW COMPARISON:  07/06/2015 radiographs FINDINGS: There is no evidence of acute fracture, subluxation or dislocation. Dorsal soft tissue swelling is noted. A remote fracture of the proximal fifth metacarpal is noted. IMPRESSION: Soft tissue swelling without acute bony abnormality. Electronically Signed   By: Harmon PierJeffrey  Hu M.D.   On: 03/04/2017 13:25    Procedures Procedures (including critical care time)  Medications Ordered in ED Medications - No data to display   Initial Impression / Assessment and Plan / ED Course  I have reviewed the triage vital signs and the nursing notes.  Pertinent labs & imaging results that were available during my care of the patient were reviewed by me and considered in my medical decision making (see chart for details).     Patient here after a right hand injury that he sustained while at work PTA. X-Ray negative for obvious fracture or dislocation. No snuffbox tenderness. No focal neurological deficits. Patient's fingers  buddy taped while in ED, conservative therapy recommended and discussed.  Pt advised to follow up with hand specialist if symptoms persist. Patient will be discharged home & is agreeable with above plan. Return precautions discussed. Pt appears safe for discharge at this time.  Final Clinical Impressions(s) / ED Diagnoses   Final diagnoses:  Injury of right hand, initial encounter    New Prescriptions New Prescriptions   No medications on file   I personally performed the services described in this documentation, which was scribed in my presence. The recorded information has been reviewed and is accurate.    Jeanie SewerFawze, Cher Egnor A, PA-C 03/04/17 1425    Doug SouJacubowitz, Sam, MD 03/04/17 1606

## 2017-03-04 NOTE — ED Notes (Signed)
Pt ambulatory and independent at discharge.  Verbalized understanding of discharge instructions 

## 2018-01-11 ENCOUNTER — Other Ambulatory Visit: Payer: Self-pay

## 2018-01-11 ENCOUNTER — Emergency Department
Admission: EM | Admit: 2018-01-11 | Discharge: 2018-01-11 | Disposition: A | Discharge status: Home / Self Care | Payer: Commercial Managed Care - HMO | Attending: Emergency Medicine | Admitting: Emergency Medicine

## 2018-01-11 ENCOUNTER — Encounter: Payer: Self-pay | Admitting: Emergency Medicine

## 2018-01-11 DIAGNOSIS — Z5321 Procedure and treatment not carried out due to patient leaving prior to being seen by health care provider: Secondary | ICD-10-CM | POA: Insufficient documentation

## 2018-01-11 DIAGNOSIS — R079 Chest pain, unspecified: Secondary | ICD-10-CM | POA: Insufficient documentation

## 2018-01-11 NOTE — ED Notes (Signed)
Pt ambulatory out of triage room stating that he is feeling better at this time and going to leave. Pt advised by this RN that no information or diagnoses can be given by an RN, he would have to see the Dr. Rock NephewPt states that he is going to leave at this time as he is unable to wait.

## 2018-01-11 NOTE — ED Triage Notes (Signed)
Pt arrives ambulatory into ED clutching chest up to officer. Pt continuing to clutch chest while being registered. At this time in triage EKG completed, however, pt refuses any blood work to be done stating "I don't have time for this"..."I need to be at work at 4". Pt told that he would need to be completely triaged and then wait for a room to become available. Pt is no longer clutching chest and told EDT that his chest pain is gone. Pt is in NAD.

## 2018-02-13 ENCOUNTER — Emergency Department
Admission: EM | Admit: 2018-02-13 | Discharge: 2018-02-13 | Disposition: A | Discharge status: Home / Self Care | Payer: 59 | Attending: Emergency Medicine | Admitting: Emergency Medicine

## 2018-02-13 ENCOUNTER — Encounter: Payer: Self-pay | Admitting: Emergency Medicine

## 2018-02-13 ENCOUNTER — Other Ambulatory Visit: Payer: Self-pay

## 2018-02-13 ENCOUNTER — Emergency Department: Payer: 59

## 2018-02-13 DIAGNOSIS — Y9389 Activity, other specified: Secondary | ICD-10-CM | POA: Insufficient documentation

## 2018-02-13 DIAGNOSIS — Y998 Other external cause status: Secondary | ICD-10-CM | POA: Insufficient documentation

## 2018-02-13 DIAGNOSIS — S93401A Sprain of unspecified ligament of right ankle, initial encounter: Secondary | ICD-10-CM

## 2018-02-13 DIAGNOSIS — S50811A Abrasion of right forearm, initial encounter: Secondary | ICD-10-CM | POA: Insufficient documentation

## 2018-02-13 DIAGNOSIS — S99911A Unspecified injury of right ankle, initial encounter: Secondary | ICD-10-CM | POA: Diagnosis present

## 2018-02-13 DIAGNOSIS — Y92410 Unspecified street and highway as the place of occurrence of the external cause: Secondary | ICD-10-CM | POA: Diagnosis not present

## 2018-02-13 DIAGNOSIS — F17210 Nicotine dependence, cigarettes, uncomplicated: Secondary | ICD-10-CM | POA: Diagnosis not present

## 2018-02-13 DIAGNOSIS — Z79899 Other long term (current) drug therapy: Secondary | ICD-10-CM | POA: Insufficient documentation

## 2018-02-13 DIAGNOSIS — S50812A Abrasion of left forearm, initial encounter: Secondary | ICD-10-CM | POA: Insufficient documentation

## 2018-02-13 MED ORDER — NABUMETONE 750 MG PO TABS: 750.0000 mg | ORAL_TABLET | Freq: Two times a day (BID) | ORAL | 0 refills | Status: DC

## 2018-02-13 NOTE — ED Notes (Signed)
Patient discharged to home per MD order. Patient in stable condition, and deemed medically cleared by ED provider for discharge. Discharge instructions reviewed with patient/family using "Teach Back"; verbalized understanding of medication education and administration, and information about follow-up care. Denies further concerns. ° °
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
## 2019-01-25 ENCOUNTER — Other Ambulatory Visit: Payer: Self-pay

## 2019-01-25 ENCOUNTER — Emergency Department (HOSPITAL_COMMUNITY)
Admission: EM | Admit: 2019-01-25 | Discharge: 2019-01-25 | Disposition: A | Discharge status: Home / Self Care | Payer: 59 | Attending: Emergency Medicine | Admitting: Emergency Medicine

## 2019-01-25 ENCOUNTER — Encounter (HOSPITAL_COMMUNITY): Payer: Self-pay | Admitting: Emergency Medicine

## 2019-01-25 DIAGNOSIS — Y9389 Activity, other specified: Secondary | ICD-10-CM | POA: Insufficient documentation

## 2019-01-25 DIAGNOSIS — Y999 Unspecified external cause status: Secondary | ICD-10-CM | POA: Insufficient documentation

## 2019-01-25 DIAGNOSIS — F17210 Nicotine dependence, cigarettes, uncomplicated: Secondary | ICD-10-CM | POA: Insufficient documentation

## 2019-01-25 DIAGNOSIS — M7918 Myalgia, other site: Secondary | ICD-10-CM | POA: Insufficient documentation

## 2019-01-25 DIAGNOSIS — Y929 Unspecified place or not applicable: Secondary | ICD-10-CM | POA: Insufficient documentation

## 2019-01-25 DIAGNOSIS — M791 Myalgia, unspecified site: Secondary | ICD-10-CM

## 2019-01-25 NOTE — Discharge Instructions (Signed)
Return if any problems. Ibuprofen for soreness 
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
## 2019-01-25 NOTE — ED Notes (Signed)
Sobriety blood drawn for officer
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
## 2019-01-25 NOTE — ED Notes (Signed)
Patient verbalizes understanding of discharge instructions. Opportunity for questioning and answers were provided. Armband removed by staff, pt discharged from ED.  
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
## 2019-11-08 ENCOUNTER — Other Ambulatory Visit: Payer: Self-pay

## 2019-11-08 ENCOUNTER — Emergency Department
Admission: EM | Admit: 2019-11-08 | Discharge: 2019-11-10 | Payer: Self-pay | Attending: Emergency Medicine | Admitting: Emergency Medicine

## 2019-11-08 ENCOUNTER — Emergency Department (HOSPITAL_COMMUNITY)
Admission: EM | Admit: 2019-11-08 | Discharge: 2019-11-08 | Disposition: A | Discharge status: Home / Self Care | Payer: Self-pay | Attending: Emergency Medicine | Admitting: Emergency Medicine

## 2019-11-08 ENCOUNTER — Encounter (HOSPITAL_COMMUNITY): Payer: Self-pay

## 2019-11-08 ENCOUNTER — Encounter: Payer: Self-pay | Admitting: Intensive Care

## 2019-11-08 DIAGNOSIS — F11188 Opioid abuse with other opioid-induced disorder: Secondary | ICD-10-CM | POA: Insufficient documentation

## 2019-11-08 DIAGNOSIS — F1310 Sedative, hypnotic or anxiolytic abuse, uncomplicated: Secondary | ICD-10-CM | POA: Diagnosis present

## 2019-11-08 DIAGNOSIS — F17210 Nicotine dependence, cigarettes, uncomplicated: Secondary | ICD-10-CM | POA: Insufficient documentation

## 2019-11-08 DIAGNOSIS — F1120 Opioid dependence, uncomplicated: Secondary | ICD-10-CM | POA: Diagnosis present

## 2019-11-08 DIAGNOSIS — K0889 Other specified disorders of teeth and supporting structures: Secondary | ICD-10-CM | POA: Insufficient documentation

## 2019-11-08 DIAGNOSIS — K029 Dental caries, unspecified: Secondary | ICD-10-CM | POA: Insufficient documentation

## 2019-11-08 DIAGNOSIS — F1910 Other psychoactive substance abuse, uncomplicated: Secondary | ICD-10-CM

## 2019-11-08 DIAGNOSIS — R45851 Suicidal ideations: Secondary | ICD-10-CM | POA: Insufficient documentation

## 2019-11-08 DIAGNOSIS — Z79899 Other long term (current) drug therapy: Secondary | ICD-10-CM | POA: Insufficient documentation

## 2019-11-08 LAB — URINE DRUG SCREEN, QUALITATIVE (ARMC ONLY)
Amphetamines, Ur Screen: NOT DETECTED
Barbiturates, Ur Screen: NOT DETECTED
Benzodiazepine, Ur Scrn: POSITIVE — AB
Cannabinoid 50 Ng, Ur ~~LOC~~: NOT DETECTED
Cocaine Metabolite,Ur ~~LOC~~: NOT DETECTED
MDMA (Ecstasy)Ur Screen: NOT DETECTED
Methadone Scn, Ur: NOT DETECTED
Opiate, Ur Screen: POSITIVE — AB
Phencyclidine (PCP) Ur S: NOT DETECTED
Tricyclic, Ur Screen: NOT DETECTED

## 2019-11-08 LAB — COMPREHENSIVE METABOLIC PANEL
ALT: 26 U/L (ref 0–44)
AST: 24 U/L (ref 15–41)
Albumin: 5.1 g/dL — ABNORMAL HIGH (ref 3.5–5.0)
Alkaline Phosphatase: 153 U/L — ABNORMAL HIGH (ref 38–126)
Anion gap: 10 (ref 5–15)
BUN: 10 mg/dL (ref 6–20)
CO2: 28 mmol/L (ref 22–32)
Calcium: 9.3 mg/dL (ref 8.9–10.3)
Chloride: 104 mmol/L (ref 98–111)
Creatinine, Ser: 1.07 mg/dL (ref 0.61–1.24)
GFR calc Af Amer: >60 mL/min (ref >60)
GFR calc non Af Amer: >60 mL/min (ref >60)
Glucose, Bld: 106 mg/dL — ABNORMAL HIGH (ref 70–99)
Potassium: 3.7 mmol/L (ref 3.5–5.1)
Sodium: 142 mmol/L (ref 135–145)
Total Bilirubin: 2.3 mg/dL — ABNORMAL HIGH (ref 0.3–1.2)
Total Protein: 8.4 g/dL — ABNORMAL HIGH (ref 6.5–8.1)

## 2019-11-08 LAB — CBC
HCT: 44.1 % (ref 39.0–52.0)
Hemoglobin: 14.7 g/dL (ref 13.0–17.0)
MCH: 27.5 pg (ref 26.0–34.0)
MCHC: 33.3 g/dL (ref 30.0–36.0)
MCV: 82.4 fL (ref 80.0–100.0)
Platelets: 290 10*3/uL (ref 150–400)
RBC: 5.35 MIL/uL (ref 4.22–5.81)
RDW: 13.8 % (ref 11.5–15.5)
WBC: 11.1 10*3/uL — ABNORMAL HIGH (ref 4.0–10.5)
nRBC: 0 % (ref 0.0–0.2)

## 2019-11-08 LAB — SALICYLATE LEVEL: Salicylate Lvl: <7 mg/dL — ABNORMAL LOW (ref 7.0–30.0)

## 2019-11-08 LAB — ETHANOL: Alcohol, Ethyl (B): <10 mg/dL (ref <10)

## 2019-11-08 LAB — ACETAMINOPHEN LEVEL: Acetaminophen (Tylenol), Serum: <10 ug/mL — ABNORMAL LOW (ref 10–30)

## 2019-11-08 MED ORDER — ONDANSETRON 4 MG PO TBDP
8.0000 mg | ORAL_TABLET | Freq: Once | ORAL | Status: AC
  Administered 2019-11-08: 20:00:00 8 mg via ORAL
  Filled 2019-11-08: qty 2

## 2019-11-08 MED ORDER — DICYCLOMINE HCL 10 MG PO CAPS
10.0000 mg | ORAL_CAPSULE | Freq: Once | ORAL | Status: AC
  Administered 2019-11-08: 20:00:00 10 mg via ORAL
  Filled 2019-11-08: qty 1

## 2019-11-08 MED ORDER — CLINDAMYCIN HCL 150 MG PO CAPS
150.0000 mg | ORAL_CAPSULE | Freq: Once | ORAL | Status: AC
  Administered 2019-11-08: 20:00:00 150 mg via ORAL
  Filled 2019-11-08: qty 1

## 2019-11-08 MED ORDER — CLINDAMYCIN HCL 150 MG PO CAPS: 300.0000 mg | ORAL_CAPSULE | Freq: Three times a day (TID) | ORAL | 0 refills | Status: AC

## 2019-11-08 NOTE — BH Assessment (Addendum)
Referral information for Psychiatric Hospitalization faxed to;    [facility] [phone]) (Denied due to no insurance)  . [facility] [phone] OR [phone]) (Fax was received, [facility] staff will contact back)   [facility] [phone]) (Staff reported to check back at 9am 11/09/19, potential discharges in the morning)

## 2019-11-08 NOTE — ED Provider Notes (Signed)
Jeremy Owen Provider Note   CSN: [id number] Arrival date & time: 11/08/19  8242     History Chief Complaint  Patient presents with  . Dental Pain    Jeremy Owen is a 29 y.o. male with PMHx anxiety and opiate abuse who presents to the ED today complaining of gradual onset, constant, throbbing, 10/10, dental pain x 4-5 months. Pt reports he has an appointment with a dentist in approximately 2 weeks for dental extractions of multiple teeth but states he has not been able to sleep well due to the pain. He reports taking Ibuprofen, Tylenol, and old left over antibiotics from previous illnesses as well as taking left over antibiotics from his grandmother. Pt states about 3 months ago he felt like he had a dental abscess and popped it himself; felt drainage from the tooth at that point but none recently. Denies recent fevers, chills, trismus, drooling, inability to swallow, facial swelling, or any other associated symptoms.   The history is provided by the patient and medical records.       Past Medical History:  Diagnosis Date  . Anxiety   . Migraines   . Opiate abuse, continuous (HCC)     There are no problems to display for this patient.   Past Surgical History:  Procedure Laterality Date  . denies         History reviewed. No pertinent family history.  Social History   Tobacco Use  . Smoking status: Current Some Day Smoker    Types: Cigarettes  . Smokeless tobacco: Never Used  Substance Use Topics  . Alcohol use: No  . Drug use: No    Comment: opiates    Home Medications Prior to Admission medications   Medication Sig Start Date End Date Taking? Authorizing Provider  albuterol (PROVENTIL HFA;VENTOLIN HFA) 108 (90 Base) MCG/ACT inhaler Inhale 1-2 puffs into the lungs every 6 (six) hours as needed for wheezing or shortness of breath. 01/21/17   Isa Rankin, MD  benzonatate (TESSALON) 200 MG capsule Take 1 capsule (200 mg  total) by mouth 3 (three) times daily as needed for cough. 01/21/17   Isa Rankin, MD  clindamycin (CLEOCIN) 150 MG capsule Take 2 capsules (300 mg total) by mouth 3 (three) times daily for 7 days. 11/08/19 11/15/19  Tanda Rockers, PA-C  nabumetone (RELAFEN) 750 MG tablet Take 1 tablet (750 mg total) by mouth 2 (two) times daily. 02/13/18   Menshew, Charlesetta Ivory, PA-C  oseltamivir (TAMIFLU) 75 MG capsule Take 1 capsule (75 mg total) by mouth every 12 (twelve) hours. 01/21/17   Isa Rankin, MD  predniSONE (DELTASONE) 50 MG tablet Take 1 tablet (50 mg total) by mouth daily. 01/21/17   Isa Rankin, MD    Allergies    Patient has no known allergies.  Review of Systems   Review of Systems  Constitutional: Negative for chills and fever.  HENT: Positive for dental problem. Negative for facial swelling and trouble swallowing.     Physical Exam Updated Vital Signs BP 130/76   Pulse 87   Temp 98 F (36.7 C) (Oral)   Resp 16   SpO2 98%   Physical Exam Vitals and nursing note reviewed.  Constitutional:      Appearance: He is not ill-appearing.  HENT:     Head: Normocephalic and atraumatic.     Right Ear: Tympanic membrane normal.     Left Ear: Tympanic membrane normal.  Mouth/Throat:      Comments: Multiple dental caries and fractured teeth throughout mouth; see diagram. Erythema and edema noted to gumline along noted teeth however no signs of definitive dental abscess. No ludwig's angina. No trismus.   No erythema or edema to posterior oropharynx. Uvula midline.  Eyes:     Conjunctiva/sclera: Conjunctivae normal.  Cardiovascular:     Rate and Rhythm: Normal rate and regular rhythm.     Pulses: Normal pulses.     Heart sounds: No murmur.  Pulmonary:     Effort: Pulmonary effort is normal.     Breath sounds: Normal breath sounds. No wheezing, rhonchi or rales.  Skin:    General: Skin is warm and dry.     Coloration: Skin is not jaundiced.    Neurological:     Mental Status: He is alert.     ED Results / Procedures / Treatments   Labs (all labs ordered are listed, but only abnormal results are displayed) Labs Reviewed - No data to display  EKG None  Radiology No results found.  Procedures Procedures (including critical care time)  Medications Ordered in ED Medications - No data to display  ED Course  I have reviewed the triage vital signs and the nursing notes.  Pertinent labs & imaging results that were available during my care of the patient were reviewed by me and considered in my medical decision making (see chart for details).  29 year old male with history of opiate abuse who presents the ED today complaining of dental pain for several months, has an appointment scheduled with a dentist in 2 weeks for dental extraction. He has multiple dental caries and fractured teeth throughout his mouth with gingival inflammation.  No signs of dental abscess to be drained today.  No signs of Ludwig's angina.  On arrival to the ED patient is afebrile, nontachycardic and nontachypneic.  He is nontoxic-appearing.  Discussed pain management as well as antibiotic with patient however he became irritated as he reports he has been taking leftover antibiotics from previous illnesses as well as  leftovers from his grandmother.  Lengthy discussion with patient regarding the fact that taking different antibiotics, 2 to 3 pills will not do much and that he would need to be on an appropriate antibiotic for a prolonged period of time to help with the inflammation of his gumline and dental pain.  There was concern for drug-seeking behavior today as patient continuously requesting something "stronger" for pain to go home with rather than ibuprofen and Tylenol.  I discussed with him the fact that given his symptoms have been ongoing for several months that I am not inclined to write narcotic pain medication for this per DEA stop act.  Patient to be  discharged home with antibiotic, ibuprofen, Tylenol for pain.  Dental resources have been given for patient in the area and he was advised that he could find a dentist that may take him in sooner than 2 weeks as I do not think his dental pain will resolve until extraction is complete.   This note was prepared using Dragon voice recognition software and may include unintentional dictation errors due to the inherent limitations of voice recognition software.     MDM Rules/Calculators/A&P                       Final Clinical Impression(s) / ED Diagnoses Final diagnoses:  Pain, dental  Dental caries    Rx / DC Orders ED Discharge  Orders         Ordered    clindamycin (CLEOCIN) 150 MG capsule  3 times daily     11/08/19 0804           Discharge Instructions     Please take antibiotics to a pharmacy of your choosing. Take as prescribed. It is important to take the entire course to cover for infection.  Keep appointment with your dentist as scheduled for the end of the month. Attached are a list of other dental clinics in the area that may be able to get you in sooner.  Continue taking 600-800 mg Ibuprofen every 6-8 hours and 1,000 mg Tylenol every 6-8 hours for pain. Apply ice packs to your face to reduce pain and swelling.        Eustaquio Maize, PA-C 11/08/19 7741    Margette Fast, MD 11/09/19 [phone]

## 2019-11-08 NOTE — ED Provider Notes (Signed)
[facility] Emergency Department Provider Note ____________________________________________   First MD Initiated Contact with Patient 11/08/19 1728     (approximate)  I have reviewed the triage vital signs and the nursing notes.   HISTORY  Chief Complaint Addiction Problem and Dental Pain    HPI Jeremy Owen is a 29 y.o. male with PMH as noted below including a history of opiate abuse who presents for a substance abuse.  The patient states that yesterday he took a pressed Xanax bar that he had previously received from a friend, and then was out of it and confused up until this afternoon.  He states that he does not remember anything that happened this morning.  He admits to making comments about wanting to kill himself and not wanting to be here, but he states that this was just because he felt so confused and strange while the pill was taking effect.  He at this time states he is feeling somewhat more alert, and he denies any SI or HI.  He is not hearing voices, and states he does not feel depressed.  He reports some chronic dental pain but denies other acute physical symptoms.  The patient states that he only takes Xanax (which is not obtained via prescription) occasionally, but does abuse Percocet daily.  He denies other drug or alcohol use.  Past Medical History:  Diagnosis Date  . Anxiety   . Migraines   . Opiate abuse, continuous ([place])     There are no problems to display for this patient.   Past Surgical History:  Procedure Laterality Date  . denies      Prior to Admission medications   Medication Sig Start Date End Date Taking? Authorizing Provider  clindamycin (CLEOCIN) 150 MG capsule Take 2 capsules (300 mg total) by mouth 3 (three) times daily for 7 days. 11/08/19 11/15/19 Yes Venter, Margaux, PA-C    Allergies Patient has no known allergies.  History reviewed. No pertinent family history.  Social History Social History   Tobacco Use   . Smoking status: Current Some Day Smoker    Types: Cigarettes  . Smokeless tobacco: Never Used  Substance Use Topics  . Alcohol use: No  . Drug use: Yes    Types: Cocaine, Marijuana    Comment: opiates, xanax    Review of Systems  Constitutional: No fever/chills Eyes: No redness. ENT: No sore throat. Cardiovascular: Denies chest pain. Respiratory: Denies shortness of breath. Gastrointestinal: No vomiting or diarrhea.  Genitourinary: Negative for dysuria.  Musculoskeletal: Negative for back pain. Skin: Negative for rash. Neurological: Negative for headache.   ____________________________________________   PHYSICAL EXAM:  VITAL SIGNS: ED Triage Vitals  Enc Vitals Group     BP 11/08/19 1727 (!) 141/84     Pulse Rate 11/08/19 1727 (!) 107     Resp 11/08/19 1727 16     Temp 11/08/19 1727 98.8 F (37.1 C)     Temp Source 11/08/19 1727 Oral     SpO2 11/08/19 1727 99 %     Weight 11/08/19 1708 130 lb (59 kg)     Height 11/08/19 1708 5\' 7"  (1.702 m)     Head Circumference --      Peak Flow --      Pain Score 11/08/19 1707 2     Pain Loc --      Pain Edu? --      Excl. in [place]? --     Constitutional: Alert and oriented.  Somewhat tired and sleepy appearing but fully awake through the interview. Eyes: Conjunctivae are normal.  Head: Atraumatic. Nose: No congestion/rhinnorhea. Mouth/Throat: Mucous membranes are moist.   Neck: Normal range of motion.  Cardiovascular: Normal rate, regular rhythm. Good peripheral circulation. Respiratory: Normal respiratory effort.  No retractions.  Gastrointestinal: No distention.  Musculoskeletal: Extremities warm and well perfused.  Neurologic:  Normal speech and language. No gross focal neurologic deficits are appreciated.  Skin:  Skin is warm and dry. No rash noted. Psychiatric: Mood and affect are normal. Speech and behavior are normal.  ____________________________________________   LABS (all labs ordered are listed, but  only abnormal results are displayed)  Labs Reviewed  COMPREHENSIVE METABOLIC PANEL - Abnormal; Notable for the following components:      Result Value   Glucose, Bld 106 (*)    Total Protein 8.4 (*)    Albumin 5.1 (*)    Alkaline Phosphatase 153 (*)    Total Bilirubin 2.3 (*)    All other components within normal limits  SALICYLATE LEVEL - Abnormal; Notable for the following components:   Salicylate Lvl <7.0 (*)    All other components within normal limits  ACETAMINOPHEN LEVEL - Abnormal; Notable for the following components:   Acetaminophen (Tylenol), Serum <10 (*)    All other components within normal limits  CBC - Abnormal; Notable for the following components:   WBC 11.1 (*)    All other components within normal limits  URINE DRUG SCREEN, QUALITATIVE ([facility] ONLY) - Abnormal; Notable for the following components:   Opiate, Ur Screen POSITIVE (*)    Benzodiazepine, Ur Scrn POSITIVE (*)    All other components within normal limits  ETHANOL   ____________________________________________  EKG   ____________________________________________  RADIOLOGY    ____________________________________________   PROCEDURES  Procedure(s) performed: No  Procedures  Critical Care performed: No ____________________________________________   INITIAL IMPRESSION / ASSESSMENT AND PLAN / ED COURSE  Pertinent labs & imaging results that were available during my care of the patient were reviewed by me and considered in my medical decision making (see chart for details).  29 year old male with a past history of opiate and benzodiazepine abuse presents after he took a Xanax of dubious origin yesterday and continued to feel very sleepy and confused most of the day today.  He states he does not remember almost anything from this morning.  He had made vague suicidal comments earlier, but now states that was just because he felt so confused and strange.  He adamantly denies any SI or HI, or any  other acute psychiatric symptoms at this time.  On exam, the patient appears a bit sleepy but is fully arousable and was able to give a full history.  Neurologic exam is nonfocal.  His vital signs are normal except for borderline tachycardia initially.  The rest of the physical exam is unremarkable.  Overall presentation is consistent with intoxication due to benzodiazepines, and the pill that he took may have had some longer acting adulterants.  The patient is somewhat ambivalent about detox.  At this time, there is no indication for involuntary commitment or emergent psychiatric evaluation.  Since the patient still appears intoxicated from these drugs, I suggested that we watch him for at least a few additional hours or possibly even overnight to make sure that the effect clears and obtain labs.  I will also have a TTS consult to evaluate for detox.  He agrees with this plan.  ----------------------------------------- 10:02 PM on 11/08/2019 -----------------------------------------  Per the TTS provider, the patient states he is interested in detox so they will work on finding him a detox bed.  He remains clinically stable.  His vital signs are normal on last recheck.  The lab work-up is unremarkable except for a minimally elevated alkaline phosphatase and an elevated bilirubin on his CMP.  This is of unclear clinical significance as the patient has no jaundice and no abdominal pain on exam.  He has no known history of liver disease.  At this time, the patient is still too sleepy to fully evaluate in terms of abdominal tenderness or other acute symptoms, so we will reassess when he is sober.  ----------------------------------------- 11:29 PM on 11/08/2019 -----------------------------------------  I signed the patient out to the oncoming physician Dr. Larinda Buttery. ____________________________________________   FINAL CLINICAL IMPRESSION(S) / ED DIAGNOSES  Final diagnoses:  Substance abuse (HCC)       NEW MEDICATIONS STARTED DURING THIS VISIT:  New Prescriptions   No medications on file     Note:  This document was prepared using Dragon voice recognition software and may include unintentional dictation errors.ress   Dionne Bucy, MD 11/08/19 2329

## 2019-11-08 NOTE — ED Notes (Signed)
Pt remains lethargic.  Offered regular diet tray per orders.  Will continue to monitor/reassess

## 2019-11-08 NOTE — ED Notes (Signed)
Patient declined taking discharge papers with prescription. Left with complaints of not being treated right and not being given pain medicine for the excruciating pain he is in.

## 2019-11-08 NOTE — ED Triage Notes (Addendum)
First RN Note: Pt presents to ED via POV with c/o needing detox, pt's cousin brought him in "to get him clean". Pt's cousin reports that pt took "some old Xanax last night". Pt seen at [facility] earlier today for dental pain.   Upon arrival pt noted with mildly slurred speech, is noted to be standing with his eyes closed and appears lethargic upon arrival, however is ambulatory and awakens with mild stimuli.

## 2019-11-08 NOTE — Discharge Instructions (Signed)
Please take antibiotics to a pharmacy of your choosing. Take as prescribed. It is important to take the entire course to cover for infection.  Keep appointment with your dentist as scheduled for the end of the month. Attached are a list of other dental clinics in the area that may be able to get you in sooner.  Continue taking 600-800 mg Ibuprofen every 6-8 hours and 1,000 mg Tylenol every 6-8 hours for pain. Apply ice packs to your face to reduce pain and swelling.

## 2019-11-08 NOTE — ED Triage Notes (Addendum)
Family reports she is unsure what type of drugs patient is on. Patient reports taking xanax. Family reports he has been having his 28 year old family member to hotels to pick up drugs. Patient is lethargic in triage. Patient is cooperative. Family reports he has made comments "I want to kill myself" "I dont want to be here anymore" Patient denies HI. When this RN asked patient if he wanted to hurt himself he responded "yes." Denies plan or history of trying to hurt self. Patient also c/o right lower dental pain

## 2019-11-08 NOTE — ED Triage Notes (Signed)
Pt c/o bilateral dental pain x 4-5 months. States he has dental appointment this month for extraction.

## 2019-11-08 NOTE — ED Notes (Signed)
TTS in speaking with pt.  

## 2019-11-08 NOTE — BH Assessment (Signed)
Assessment Note  Jeremy Owen is an 29 y.o. male presenting to [facility] ED voluntarily brought in by his cousin for substance abuse. Patient had just visited [facility] ED today at 7:39am and was being seen for dental pain, per triage note Pt c/o bilateral dental pain x 4-5 months. Patient reported to staff that he has an upcoming dental extraction and has been dealing with his pain for 4-5 months. Patient was seen by a Physician Assistant that assessed the patient for his pain and discussed pain management as well as antibiotic with patient however he became irritated. Per physician assistant there was concern for drug-seeking behavior today as patient continuously requesting something "stronger" for pain to go home with rather than ibuprofen and Tylenol. Physician assistant provided Dental resources to patient and he was discharged.   Patient arrived to [facility] ED today at 4:54pm, per triage note Pt presents to ED via POV with c/o needing detox, pt's cousin brought him in "to get him clean". Pt's cousin reports that pt took "some old Xanax last night". Pt seen at [facility] earlier today for dental pain. Upon arrival pt noted with mildly slurred speech, is noted to be standing with his eyes closed and appears lethargic upon arrival, however is ambulatory and awakens with mild stimuli. Family reports she is unsure what type of drugs patient is on. Patient reports taking xanax. Family reports he has been having his 28 year old family member to hotels to pick up drugs. Patient is lethargic in triage. Patient is cooperative. Family reports he has made comments "I want to kill myself" "I dont want to be here anymore" Patient denies HI. When this RN asked patient if he wanted to hurt himself he responded "yes." Denies plan or history of trying to hurt self. Patient also c/o right lower dental pain.   During assessment patient continues to be lethargic, drowsy, and slow to stand up with his eyes closed. Patient reported "yesterday I  woke up and I took a Xanax, it was a Xanax imprint, after that I asked someone to come pick me up." Patient reported his drug of choice being "pain killers, percocet." Patient reported his last date of use for opiates 11/02/19 and reported that he used "1 pill." Patient reported at most he has used "60mg ." Patient reported age of first use for Opioids at 47 "my grandmother had a bunch of them she would say here you can't sleep, here you've got a headache." Patient reported taking "Xanax" yesterday 11/07/19 and reported amounts "6mg  of something called Kolonozam." Patient denies using any substances today. Patient UDS is positive for Opiates and Benzodiazepines. Patient reported having substance abuse treatment in the past at [facility] for Medication assisted treatment, he reported treatment was "years ago" and is not currently engaged with treatment. Patient reported that he is currently interested in detox treatment. Patient currently denies SI/HI/AH/VH and does not appear to be responding to any internal or external stimuli.   Patient is recommended for Substance Abuse Detox Treatment, patient verbally gave consent to find an appropriate treatment facility.   Diagnosis: Opioid Use Disorder, Severe  Past Medical History:  Past Medical History:  Diagnosis Date  . Anxiety   . Migraines   . Opiate abuse, continuous (HCC)     Past Surgical History:  Procedure Laterality Date  . denies      Family History: History reviewed. No pertinent family history.  Social History:  reports that he has been smoking cigarettes. He has  never used smokeless tobacco. He reports current drug use. Drugs: Cocaine and Marijuana. He reports that he does not drink alcohol.  Additional Social History:  Alcohol / Drug Use Pain Medications: See MAR Prescriptions: See MAR Over the Counter: See MAR History of alcohol / drug use?: Yes Substance #1 Name of Substance 1: Opioids 1 - Age of First Use: 11 1  - Amount (size/oz): "1 pill" At most "60mg " 1 - Frequency: unknown 1 - Duration: 17 years 1 - Last Use / Amount: 11/02/19  CIWA: CIWA-Ar BP: 122/77 Pulse Rate: 77 COWS: Clinical Opiate Withdrawal Scale (COWS) Resting Pulse Rate: Pulse Rate 101-120 Sweating: No report of chills or flushing Restlessness: Able to sit still Pupil Size: Pupils pinned or normal size for room light Bone or Joint Aches: Not present Runny Nose or Tearing: Not present GI Upset: No GI symptoms Tremor: No tremor Yawning: Yawning once or twice during assessment Anxiety or Irritability: None Gooseflesh Skin: Skin is smooth COWS Total Score: 3  Allergies: No Known Allergies  Home Medications: (Not in a hospital admission)   OB/GYN Status:  No LMP for male patient.  General Assessment Data Location of Assessment: [facility] ED TTS Assessment: In system Is this a Tele or Face-to-Face Assessment?: Face-to-Face Is this an Initial Assessment or a Re-assessment for this encounter?: Initial Assessment Patient Accompanied by:: N/A Language Other than English: No Living Arrangements: Other (Comment)(Private residence) What gender do you identify as?: Male Marital status: Single Living Arrangements: Alone Can pt return to current living arrangement?: Yes Admission Status: Voluntary Is patient capable of signing voluntary admission?: Yes Referral Source: Self/Family/Friend Insurance type: None  Medical Screening Exam [facility] Walk-in ONLY) Medical Exam completed: Yes  Crisis Care Plan Living Arrangements: Alone Legal Guardian: Other:(Self) Name of Psychiatrist: None Name of Therapist: None  Education Status Is patient currently in school?: No Is the patient employed, unemployed or receiving disability?: Employed  Risk to self with the past 6 months Suicidal Ideation: No Has patient been a risk to self within the past 6 months prior to admission? : No Suicidal Intent: No Has patient had any suicidal intent  within the past 6 months prior to admission? : No Is patient at risk for suicide?: No Suicidal Plan?: No Has patient had any suicidal plan within the past 6 months prior to admission? : No Access to Means: No What has been your use of drugs/alcohol within the last 12 months?: Opioids and Benzodiazpines Previous Attempts/Gestures: No Intentional Self Injurious Behavior: None Family Suicide History: No Recent stressful life event(s): Other (Comment)(None known) Persecutory voices/beliefs?: No Depression: Yes Depression Symptoms: Loss of interest in usual pleasures Substance abuse history and/or treatment for substance abuse?: Yes Suicide prevention information given to non-admitted patients: Not applicable  Risk to Others within the past 6 months Homicidal Ideation: No Does patient have any lifetime risk of violence toward others beyond the six months prior to admission? : No Thoughts of Harm to Others: No Current Homicidal Intent: No Current Homicidal Plan: No Access to Homicidal Means: No History of harm to others?: No Assessment of Violence: None Noted Does patient have access to weapons?: No Criminal Charges Pending?: No Does patient have a court date: No Is patient on probation?: No  Psychosis Hallucinations: None noted Delusions: None noted  Mental Status Report Appearance/Hygiene: In scrubs Eye Contact: Poor Motor Activity: Freedom of movement Speech: Pressured, Soft Level of Consciousness: Alert, Drowsy Mood: Worthless, low self-esteem, Depressed Affect: Flat Anxiety Level: Minimal Thought Processes: Coherent Judgement:  Partial Orientation: Person, Place, Time, Situation, Appropriate for developmental age Obsessive Compulsive Thoughts/Behaviors: None  Cognitive Functioning Concentration: Fair Memory: Recent Intact, Remote Intact Is patient IDD: No Insight: Poor Impulse Control: Poor Appetite: Good Have you had any weight changes? : No Change Sleep: No  Change Total Hours of Sleep: 6 Vegetative Symptoms: None  ADLScreening [facility] Assessment Services) Patient's cognitive ability adequate to safely complete daily activities?: Yes Patient able to express need for assistance with ADLs?: Yes Independently performs ADLs?: Yes (appropriate for developmental age)  Prior Inpatient Therapy Prior Inpatient Therapy: No  Prior Outpatient Therapy Prior Outpatient Therapy: Yes Prior Therapy Dates: "years ago" could not recall Prior Therapy Facilty/Provider(s): [facility] Reason for Treatment: Medication assisted Treatment Does patient have an ACCT team?: No Does patient have Intensive In-House Services?  : No Does patient have [facility] services? : No Does patient have P4CC services?: No  ADL Screening (condition at time of admission) Patient's cognitive ability adequate to safely complete daily activities?: Yes Is the patient deaf or have difficulty hearing?: No Does the patient have difficulty seeing, even when wearing glasses/contacts?: No Does the patient have difficulty concentrating, remembering, or making decisions?: No Patient able to express need for assistance with ADLs?: Yes Does the patient have difficulty dressing or bathing?: No Independently performs ADLs?: Yes (appropriate for developmental age) Does the patient have difficulty walking or climbing stairs?: No Weakness of Legs: None Weakness of Arms/Hands: None  Home Assistive Devices/Equipment Home Assistive Devices/Equipment: None  Therapy Consults (therapy consults require a physician order) PT Evaluation Needed: No OT Evalulation Needed: No SLP Evaluation Needed: No Abuse/Neglect Assessment (Assessment to be complete while patient is alone) Abuse/Neglect Assessment Can Be Completed: Yes Physical Abuse: Denies Verbal Abuse: Denies Sexual Abuse: Denies Exploitation of patient/patient's resources: Denies Self-Neglect: Denies Values / Beliefs Cultural  Requests During Hospitalization: None Spiritual Requests During Hospitalization: None Consults Spiritual Care Consult Needed: No Transition of Care Team Consult Needed: No Advance Directives (For Healthcare) Does Patient Have a Medical Advance Directive?: No Would patient like information on creating a medical advance directive?: No - Patient declined          Disposition: Patient is recommended for Substance Abuse Detox Treatment, patient verbally gave consent to find an appropriate treatment facility.  Disposition Initial Assessment Completed for this Encounter: Yes Patient referred to: RTS, Other (Comment)(Appropriate Detox facilities)  On Site Evaluation by:   Reviewed with Physician:    Benay Pike MS LCASA 11/08/2019 8:35 PM

## 2019-11-09 DIAGNOSIS — F1910 Other psychoactive substance abuse, uncomplicated: Secondary | ICD-10-CM | POA: Insufficient documentation

## 2019-11-09 DIAGNOSIS — F1310 Sedative, hypnotic or anxiolytic abuse, uncomplicated: Secondary | ICD-10-CM | POA: Diagnosis present

## 2019-11-09 DIAGNOSIS — F1120 Opioid dependence, uncomplicated: Secondary | ICD-10-CM | POA: Diagnosis present

## 2019-11-09 MED ORDER — GABAPENTIN 300 MG PO CAPS
300.0000 mg | ORAL_CAPSULE | Freq: Three times a day (TID) | ORAL | Status: DC
  Administered 2019-11-09 – 2019-11-10 (×4): 300 mg via ORAL
  Filled 2019-11-09 (×4): qty 1

## 2019-11-09 MED ORDER — NAPROXEN 500 MG PO TABS
500.0000 mg | ORAL_TABLET | Freq: Two times a day (BID) | ORAL | Status: DC | PRN
Start: 2019-11-09 — End: 2019-11-10
  Filled 2019-11-09: qty 1

## 2019-11-09 MED ORDER — HYDROXYZINE HCL 25 MG PO TABS
25.0000 mg | ORAL_TABLET | Freq: Three times a day (TID) | ORAL | Status: DC | PRN
  Administered 2019-11-09 – 2019-11-10 (×2): 25 mg via ORAL
  Filled 2019-11-09 (×2): qty 1

## 2019-11-09 MED ORDER — LOPERAMIDE HCL 2 MG PO CAPS
2.0000 mg | ORAL_CAPSULE | ORAL | Status: DC | PRN
  Filled 2019-11-09: qty 2

## 2019-11-09 MED ORDER — CLONIDINE HCL 0.1 MG PO TABS: 0.1000 mg | ORAL_TABLET | Freq: Every day | ORAL | Status: DC

## 2019-11-09 MED ORDER — DICYCLOMINE HCL 20 MG PO TABS
20.0000 mg | ORAL_TABLET | Freq: Four times a day (QID) | ORAL | Status: DC | PRN
Start: 2019-11-09 — End: 2019-11-10
  Filled 2019-11-09: qty 1

## 2019-11-09 MED ORDER — METHOCARBAMOL 500 MG PO TABS
500.0000 mg | ORAL_TABLET | Freq: Three times a day (TID) | ORAL | Status: DC | PRN
  Filled 2019-11-09: qty 1

## 2019-11-09 MED ORDER — HYDROXYZINE HCL 25 MG PO TABS: 25.0000 mg | ORAL_TABLET | Freq: Four times a day (QID) | ORAL | Status: DC | PRN

## 2019-11-09 MED ORDER — CLONIDINE HCL 0.1 MG PO TABS
0.1000 mg | ORAL_TABLET | Freq: Four times a day (QID) | ORAL | Status: DC
  Administered 2019-11-09 – 2019-11-10 (×5): 0.1 mg via ORAL
  Filled 2019-11-09 (×5): qty 1

## 2019-11-09 MED ORDER — CLONIDINE HCL 0.1 MG PO TABS
0.1000 mg | ORAL_TABLET | ORAL | Status: DC
Start: 2019-11-11 — End: 2019-11-10

## 2019-11-09 MED ORDER — ONDANSETRON 4 MG PO TBDP
4.0000 mg | ORAL_TABLET | Freq: Four times a day (QID) | ORAL | Status: DC | PRN
Start: 2019-11-09 — End: 2019-11-10
  Filled 2019-11-09: qty 1

## 2019-11-09 MED ORDER — CLONIDINE HCL 0.1 MG PO TABS
0.1000 mg | ORAL_TABLET | Freq: Once | ORAL | Status: AC
  Administered 2019-11-09: 05:00:00 0.1 mg via ORAL
  Filled 2019-11-09: qty 1

## 2019-11-09 NOTE — ED Notes (Signed)
Pt given meal tray and phone to talk to detox facility that may have a bed for him.

## 2019-11-09 NOTE — ED Notes (Signed)
Pt states that facility cannot accept him with impending court date. Pt requests something for anxiety. Asher Muir, NP notified.

## 2019-11-09 NOTE — ED Notes (Signed)
Pt given meal tray and is eating without issue

## 2019-11-09 NOTE — ED Notes (Signed)
Report to include Situation, Background, Assessment, and Recommendations received from Sarah RN. Patient alert and oriented, warm and dry, in no acute distress. Patient denies SI, HI, AVH and pain. Patient made aware of Q15 minute rounds and Rover and Officer presence for their safety. Patient instructed to come to me with needs or concerns.   

## 2019-11-09 NOTE — ED Provider Notes (Signed)
-----------------------------------------   3:58 AM on 11/09/2019 -----------------------------------------  Blood pressure 122/77, pulse 77, temperature 97.7 F (36.5 C), temperature source Oral, resp. rate 18, height 5\' 7"  (1.702 m), weight 59 kg, SpO2 100 %.  The patient is calm and cooperative at this time.  There have been no acute events since the last update.  Awaiting disposition plan from Behavioral Medicine team.   , MD 11/09/19 [phone]

## 2019-11-09 NOTE — Progress Notes (Signed)
[facility] needs a lawyer letter from him saying they will represent him in court on March 1 which is mother is obtaining tomorrow.  It will need to be faxed then.  If a bed is available, he will be accepted.  Prescreen completed, new psych note faxed.  [facility]S reports they should have a bed for him tomorrow.  Nanine Means, PMHNP

## 2019-11-09 NOTE — Progress Notes (Signed)
RTS called at [phone] and staff stated it would be tomorrow as he has one admission today and is all alone.  Message left at [facility] at 945 am.  Clonidine opiate withdrawal protocol started along with gabapentin 300 mg TID for withdrawal.  Nanine Means, PMHNP

## 2019-11-09 NOTE — ED Notes (Signed)
Pt awake and complains of stomach discomfort. States "I think I'm going into withdrawals".

## 2019-11-09 NOTE — Consult Note (Addendum)
[facility] Face-to-Face Psychiatry Consult   Reason for Consult:  Rehab request Referring Physician:  EDP Patient Identification: Jeremy Owen MRN:  [id number] Principal Diagnosis: Polysubstance dependence Diagnosis:  Active Problems:   * No active hospital problems. *   Total Time spent with patient: 1 hour  Subjective:   Jeremy Owen is a 29 y.o. male patient admitted with substance abuse.  Patient seen and evaluated in person by this provider.  Chatting to his girlfriend on the phone with some anxiety as he is sitting on his bed.  Denies suicidal and homicidal ideations and hallucinations.  He was having some abdominal pain earlier, clonidine opiate withdrawal protocol started along with gabapentin.  Reports his main drug of choice is heroin but recently using Xanax.  The person on the phone reports he has been using a lot more than he reports.  Patient still vested in going to rehab.  HPI on admission per TTS:  Jeremy Owen is an 29 y.o. male presenting to [facility] ED voluntarily brought in by his cousin for substance abuse. Patient had just visited [facility] ED today at 7:39am and was being seen for dental pain, per triage note Pt c/o bilateral dental pain x 4-5 months. Patient reported to staff that he has an upcoming dental extraction and has been dealing with his pain for 4-5 months. Patient was seen by a Physician Assistant that assessed the patient for his pain and discussed pain management as well as antibiotic with patient however he became irritated. Per physician assistant there was concern for drug-seeking behavior today as patient continuously requesting something"stronger"for pain to go home with rather than ibuprofen and Tylenol. Physician assistant provided Dental resources to patient and he was discharged.   Patient arrived to [facility] ED today at 4:54pm, per triage note Pt presents to ED via POV with c/o needing detox, pt's cousin brought him in "to get him clean". Pt's cousin reports that  pt took "some old Xanax last night". Pt seen at [facility] earlier today for dental pain.Upon arrival pt noted with mildly slurred speech, is noted to be standing with his eyes closed and appears lethargic upon arrival, however is ambulatory and awakens with mild stimuli.Family reports she is unsure what type of drugs patient is on. Patient reports taking xanax. Family reports he has been having his 28 year old family member to hotels to pick up drugs. Patient is lethargic in triage. Patient is cooperative. Family reports he has made comments "I want to kill myself" "I dont want to be here anymore" Patient denies HI. When this RN asked patient if he wanted to hurt himself he responded "yes." Denies plan or history of trying to hurt self.Patient also c/o right lower dental pain.   During assessment patient continues to be lethargic, drowsy, and slow to stand up with his eyes closed. Patient reported "yesterday I woke up and I took a Xanax, it was a Xanax imprint, after that I asked someone to come pick me up." Patient reported his drug of choice being "pain killers, percocet." Patient reported his last date of use for opiates 11/02/19 and reported that he used "1 pill." Patient reported at most he has used "60mg ." Patient reported age of first use for Opioids at 31 "my grandmother had a bunch of them she would say here you can't sleep, here you've got a headache." Patient reported taking "Xanax" yesterday 11/07/19 and reported amounts "6mg  of something called Kolonozam." Patient denies using any substances today. Patient UDS  is positive for Opiates and Benzodiazepines. Patient reported having substance abuse treatment in the past at [facility] for Medication assisted treatment, he reported treatment was "years ago" and is not currently engaged with treatment. Patient reported that he is currently interested in detox treatment. Patient currently denies SI/HI/AH/VH and does not appear to be responding to  any internal or external stimuli.   Past Psychiatric History: substance use  Risk to Self: Suicidal Ideation: No Suicidal Intent: No Is patient at risk for suicide?: No Suicidal Plan?: No Access to Means: No What has been your use of drugs/alcohol within the last 12 months?: Opioids and Benzodiazpines Intentional Self Injurious Behavior: None Risk to Others: Homicidal Ideation: No Thoughts of Harm to Others: No Current Homicidal Intent: No Current Homicidal Plan: No Access to Homicidal Means: No History of harm to others?: No Assessment of Violence: None Noted Does patient have access to weapons?: No Criminal Charges Pending?: No Does patient have a court date: No Prior Inpatient Therapy: Prior Inpatient Therapy: No Prior Outpatient Therapy: Prior Outpatient Therapy: Yes Prior Therapy Dates: "years ago" could not recall Prior Therapy Facilty/Provider(s): [facility] Reason for Treatment: Medication assisted Treatment Does patient have an ACCT team?: No Does patient have Intensive In-House Services?  : No Does patient have [facility] services? : No Does patient have P4CC services?: No  Past Medical History:  Past Medical History:  Diagnosis Date  . Anxiety   . Migraines   . Opiate abuse, continuous (HCC)     Past Surgical History:  Procedure Laterality Date  . denies     Family History: History reviewed. No pertinent family history. Family Psychiatric  History: none Social History:  Social History   Substance and Sexual Activity  Alcohol Use No     Social History   Substance and Sexual Activity  Drug Use Yes  . Types: Cocaine, Marijuana   Comment: opiates, xanax    Social History   Socioeconomic History  . Marital status: Single    Spouse name: Not on file  . Number of children: Not on file  . Years of education: Not on file  . Highest education level: Not on file  Occupational History  . Not on file  Tobacco Use  . Smoking status:  Current Some Day Smoker    Types: Cigarettes  . Smokeless tobacco: Never Used  Substance and Sexual Activity  . Alcohol use: No  . Drug use: Yes    Types: Cocaine, Marijuana    Comment: opiates, xanax  . Sexual activity: Yes    Birth control/protection: Abstinence, Condom  Other Topics Concern  . Not on file  Social History Narrative  . Not on file   Social Determinants of Health   Financial Resource Strain:   . Difficulty of Paying Living Expenses: Not on file  Food Insecurity:   . Worried About Programme researcher, broadcasting/film/video in the Last Year: Not on file  . Ran Out of Food in the Last Year: Not on file  Transportation Needs:   . Lack of Transportation (Medical): Not on file  . Lack of Transportation (Non-Medical): Not on file  Physical Activity:   . Days of Exercise per Week: Not on file  . Minutes of Exercise per Session: Not on file  Stress:   . Feeling of Stress : Not on file  Social Connections:   . Frequency of Communication with Friends and Family: Not on file  . Frequency of Social Gatherings with Friends and  Family: Not on file  . Attends Religious Services: Not on file  . Active Member of Clubs or Organizations: Not on file  . Attends Banker Meetings: Not on file  . Marital Status: Not on file   Additional Social History:    Allergies:  No Known Allergies  Labs:  Results for orders placed or performed during the hospital encounter of 11/08/19 (from the past 48 hour(s))  Comprehensive metabolic panel     Status: Abnormal   Collection Time: 11/08/19  5:14 PM  Result Value Ref Range   Sodium 142 135 - 145 mmol/L   Potassium 3.7 3.5 - 5.1 mmol/L   Chloride 104 98 - 111 mmol/L   CO2 28 22 - 32 mmol/L   Glucose, Bld 106 (H) 70 - 99 mg/dL   BUN 10 6 - 20 mg/dL   Creatinine, Ser 4.43 0.61 - 1.24 mg/dL   Calcium 9.3 8.9 - 15.4 mg/dL   Total Protein 8.4 (H) 6.5 - 8.1 g/dL   Albumin 5.1 (H) 3.5 - 5.0 g/dL   AST 24 15 - 41 U/L   ALT 26 0 - 44 U/L    Alkaline Phosphatase 153 (H) 38 - 126 U/L   Total Bilirubin 2.3 (H) 0.3 - 1.2 mg/dL   GFR calc non Af Amer >60 >60 mL/min   GFR calc Af Amer >60 >60 mL/min   Anion gap 10 5 - 15    Comment: Performed at [facility], [address] [place], [place] [address]  Ethanol     Status: None   Collection Time: 11/08/19  5:14 PM  Result Value Ref Range   Alcohol, Ethyl (B) <10 <10 mg/dL    Comment: (NOTE) Lowest detectable limit for serum alcohol is 10 mg/dL. For medical purposes only. Performed at [facility], [address] [place], [place] [address]   Salicylate level     Status: Abnormal   Collection Time: 11/08/19  5:14 PM  Result Value Ref Range   Salicylate Lvl <7.0 (L) 7.0 - 30.0 mg/dL    Comment: Performed at [facility], [address] [place], [place] [address]  Acetaminophen level     Status: Abnormal   Collection Time: 11/08/19  5:14 PM  Result Value Ref Range   Acetaminophen (Tylenol), Serum <10 (L) 10 - 30 ug/mL    Comment: (NOTE) Therapeutic concentrations vary significantly. A range of 10-30 ug/mL  may be an effective concentration for many patients. However, some  are best treated at concentrations outside of this range. Acetaminophen concentrations >150 ug/mL at 4 hours after ingestion  and >50 ug/mL at 12 hours after ingestion are often associated with  toxic reactions. Performed at [facility], [address] [place], [place] [address]   cbc     Status: Abnormal   Collection Time: 11/08/19  5:14 PM  Result Value Ref Range   WBC 11.1 (H) 4.0 - 10.5 K/uL   RBC 5.35 4.22 - 5.81 MIL/uL   Hemoglobin 14.7 13.0 - 17.0 g/dL   HCT 09.9 83.3 - 82.5 %   MCV 82.4 80.0 - 100.0 fL   MCH 27.5 26.0 - 34.0 pg   MCHC 33.3 30.0 - 36.0 g/dL   RDW 05.3 97.6 - 73.4 %   Platelets 290 150 - 400 K/uL   nRBC 0.0 0.0 - 0.2 %    Comment: Performed at [facility], [address], [place], [place] [address]  Urine Drug Screen,  Qualitative  Status: Abnormal   Collection Time: 11/08/19  5:14 PM  Result Value Ref Range   Tricyclic, Ur Screen NONE DETECTED NONE DETECTED   Amphetamines, Ur Screen NONE DETECTED NONE DETECTED   MDMA (Ecstasy)Ur Screen NONE DETECTED NONE DETECTED   Cocaine Metabolite,Ur [place] NONE DETECTED NONE DETECTED   Opiate, Ur Screen POSITIVE (A) NONE DETECTED   Phencyclidine (PCP) Ur S NONE DETECTED NONE DETECTED   Cannabinoid 50 Ng, Ur [place] NONE DETECTED NONE DETECTED   Barbiturates, Ur Screen NONE DETECTED NONE DETECTED   Benzodiazepine, Ur Scrn POSITIVE (A) NONE DETECTED   Methadone Scn, Ur NONE DETECTED NONE DETECTED    Comment: (NOTE) Tricyclics + metabolites, urine    Cutoff 1000 ng/mL Amphetamines + metabolites, urine  Cutoff 1000 ng/mL MDMA (Ecstasy), urine              Cutoff 500 ng/mL Cocaine Metabolite, urine          Cutoff 300 ng/mL Opiate + metabolites, urine        Cutoff 300 ng/mL Phencyclidine (PCP), urine         Cutoff 25 ng/mL Cannabinoid, urine                 Cutoff 50 ng/mL Barbiturates + metabolites, urine  Cutoff 200 ng/mL Benzodiazepine, urine              Cutoff 200 ng/mL Methadone, urine                   Cutoff 300 ng/mL The urine drug screen provides only a preliminary, unconfirmed analytical test result and should not be used for non-medical purposes. Clinical consideration and professional judgment should be applied to any positive drug screen result due to possible interfering substances. A more specific alternate chemical method must be used in order to obtain a confirmed analytical result. Gas chromatography / mass spectrometry (GC/MS) is the preferred confirmat ory method. Performed at [facility], [address], [place], [place] [address]     Current Facility-Administered Medications  Medication Dose Route Frequency Provider Last Rate Last Admin  . cloNIDine (CATAPRES) tablet 0.1 mg  0.1 mg Oral QID Charm Rings, NP   0.1 mg at  11/09/19 1012   Followed by  . [START ON 11/11/2019] cloNIDine (CATAPRES) tablet 0.1 mg  0.1 mg Oral BH-qamhs Aleaya Latona, Herminio Heads, NP       Followed by  . [START ON 11/13/2019] cloNIDine (CATAPRES) tablet 0.1 mg  0.1 mg Oral QAC breakfast Charm Rings, NP      . dicyclomine (BENTYL) tablet 20 mg  20 mg Oral Q6H PRN Charm Rings, NP      . gabapentin (NEURONTIN) capsule 300 mg  300 mg Oral TID Charm Rings, NP   300 mg at 11/09/19 1012  . hydrOXYzine (ATARAX/VISTARIL) tablet 25 mg  25 mg Oral Q6H PRN Charm Rings, NP      . loperamide (IMODIUM) capsule 2-4 mg  2-4 mg Oral PRN Charm Rings, NP      . methocarbamol (ROBAXIN) tablet 500 mg  500 mg Oral Q8H PRN Charm Rings, NP      . naproxen (NAPROSYN) tablet 500 mg  500 mg Oral BID PRN Charm Rings, NP      . ondansetron (ZOFRAN-ODT) disintegrating tablet 4 mg  4 mg Oral Q6H PRN Charm Rings, NP       Current Outpatient Medications  Medication Sig Dispense Refill  .  clindamycin (CLEOCIN) 150 MG capsule Take 2 capsules (300 mg total) by mouth 3 (three) times daily for 7 days. 42 capsule 0    Musculoskeletal: Strength & Muscle Tone: within normal limits Gait & Station: normal Patient leans: N/A  Psychiatric Specialty Exam: Physical Exam  Nursing note and vitals reviewed. Constitutional: He is oriented to person, place, and time. He appears well-developed and well-nourished.  HENT:  Head: Normocephalic.  Respiratory: Effort normal.  Musculoskeletal:        General: Normal range of motion.     Cervical back: Normal range of motion.  Neurological: He is alert and oriented to person, place, and time.  Psychiatric: His speech is normal and behavior is normal. Judgment and thought content normal. His mood appears anxious. Cognition and memory are normal.    Review of Systems  Psychiatric/Behavioral: The patient is nervous/anxious.   All other systems reviewed and are negative.   Blood pressure 120/74, pulse 81,  temperature 97.9 F (36.6 C), temperature source Oral, resp. rate 17, height 5\' 7"  (1.702 m), weight 59 kg, SpO2 99 %.Body mass index is 20.36 kg/m.  General Appearance: Casual  Eye Contact:  Good  Speech:  Normal Rate  Volume:  Normal  Mood:  Anxious  Affect:  Congruent  Thought Process:  Coherent and Descriptions of Associations: Intact  Orientation:  Full (Time, Place, and Person)  Thought Content:  WDL and Logical  Suicidal Thoughts:  No  Homicidal Thoughts:  No  Memory:  Immediate;   Good Recent;   Good Remote;   Good  Judgement:  Fair  Insight:  Fair  Psychomotor Activity:  Normal  Concentration:  Concentration: Fair and Attention Span: Fair  Recall:  [employer] of Knowledge:  Fair  Language:  Good  Akathisia:  No  Handed:  Right  AIMS (if indicated):     Assets:  Housing Leisure Time Physical Health Resilience Social Support  ADL's:  Intact  Cognition:  WNL  Sleep:        Treatment Plan Summary: 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    Waylan Boga, NP 11/09/2019 12:50 PM

## 2019-11-10 NOTE — ED Notes (Addendum)
Pt's mom updated on plan of care. Pt given phone and talking to mom at this time.

## 2019-11-10 NOTE — ED Notes (Signed)
Esign not available pt verbalized discharge instructions and has no questions at this time. 

## 2019-11-10 NOTE — ED Notes (Signed)
Pt given cola and updated on breakfast coming soon.

## 2019-11-10 NOTE — ED Notes (Addendum)
Robert from [facility] called and updated on pt's status. Information given to [facility]. Per [facility] they will call when transportation is ready and set up.

## 2019-11-10 NOTE — ED Notes (Addendum)
Pt given drink. NT states pt seems to have some anxiety and states he hasn't had a BM in several days. Will ask if he wanted anything for his constipation or for the anxiety.

## 2019-11-10 NOTE — ED Notes (Signed)
Pt given clothes and informed that [facility] has transportation on the way. Pt understands.

## 2019-11-10 NOTE — ED Notes (Signed)
Pt stating to NT that he has not had a BM in over a week. Pt states he doesn't want any medication at this time.

## 2019-11-10 NOTE — ED Notes (Signed)
[facility] called and states they are on their way with transportation.

## 2019-11-10 NOTE — ED Notes (Signed)
Pt given lunch tray.

## 2019-11-10 NOTE — ED Provider Notes (Signed)
-----------------------------------------   5:52 AM on 11/10/2019 -----------------------------------------   Blood pressure 120/74, pulse 81, temperature 97.9 F (36.6 C), temperature source Oral, resp. rate 17, height 5\' 7"  (1.702 m), weight 59 kg, SpO2 99 %.  The patient is calm and cooperative at this time.  There have been no acute events since the last update.  Awaiting disposition plan from Behavioral Medicine and/or Social Work team(s).    , MD 11/10/19 [phone]

## 2019-11-10 NOTE — ED Notes (Signed)
Pt given breakfast tray

## 2019-11-10 NOTE — ED Notes (Signed)
Pt escorted to [facility] van.

## 2019-11-10 NOTE — ED Notes (Signed)
[facility] arrived to pick up pt. Mom of pt called and updated.

## 2021-12-11 ENCOUNTER — Ambulatory Visit
Admission: EM | Admit: 2021-12-11 | Discharge: 2021-12-11 | Disposition: A | Discharge status: Home / Self Care | Payer: BC Managed Care – PPO | Attending: Physician Assistant | Admitting: Physician Assistant

## 2021-12-11 ENCOUNTER — Other Ambulatory Visit: Payer: Self-pay

## 2021-12-11 DIAGNOSIS — Z20818 Contact with and (suspected) exposure to other bacterial communicable diseases: Secondary | ICD-10-CM

## 2021-12-11 DIAGNOSIS — J029 Acute pharyngitis, unspecified: Secondary | ICD-10-CM | POA: Diagnosis not present

## 2021-12-11 LAB — POCT RAPID STREP A (OFFICE): Rapid Strep A Screen: NEGATIVE

## 2021-12-11 MED ORDER — AMOXICILLIN 500 MG PO CAPS: 500.0000 mg | ORAL_CAPSULE | Freq: Three times a day (TID) | ORAL | 0 refills | Status: AC

## 2021-12-11 NOTE — ED Provider Notes (Signed)
?[facility] ? ? ? ?CSN: [id number] ?Arrival date & time: 12/11/21  1348 ? ? ?  ? ?History   ?Chief Complaint ?Chief Complaint  ?Patient presents with  ? Sore Throat  ? ? ?HPI ?Jeremy Owen is a 31 y.o. male.  ? ?Patient is here today for evaluation of sore throat since he woke this morning. He does note subjective fever today as well. Significant other has recently been diagnosed with strep. He denies any cough. He does not report vomiting or diarrhea. He has tried ibuprofen with mild relief.  ? ?The history is provided by the patient.  ?Sore Throat ?Pertinent negatives include no abdominal pain and no shortness of breath.  ? ?Past Medical History:  ?Diagnosis Date  ? Anxiety   ? Migraines   ? Opiate abuse, continuous (HCC)   ? ? ?Patient Active Problem List  ? Diagnosis Date Noted  ? Heroin use disorder, moderate, dependence (HCC) 11/09/2019  ? Benzodiazepine abuse (HCC) 11/09/2019  ? Substance abuse (HCC)   ? ? ?Past Surgical History:  ?Procedure Laterality Date  ? denies    ? ? ? ? ? ?Home Medications   ? ?Prior to Admission medications   ?Medication Sig Start Date End Date Taking? Authorizing Provider  ?amoxicillin (AMOXIL) 500 MG capsule Take 1 capsule (500 mg total) by mouth 3 (three) times daily. 12/11/21  Yes Tomi Bamberger, PA-C  ? ? ?Family History ?Family History  ?Family history unknown: Yes  ? ? ?Social History ?Social History  ? ?Tobacco Use  ? Smoking status: Some Days  ?  Types: Cigarettes  ? Smokeless tobacco: Never  ?Substance Use Topics  ? Alcohol use: No  ? Drug use: Yes  ?  Types: Cocaine, Marijuana  ?  Comment: opiates, xanax  ? ? ? ?Allergies   ?Patient has no known allergies. ? ? ?Review of Systems ?Review of Systems  ?Constitutional:  Positive for fever. Negative for chills.  ?HENT:  Positive for congestion and sore throat. Negative for ear pain.   ?Eyes:  Negative for discharge and redness.  ?Respiratory:  Negative for cough and shortness of breath.   ?Gastrointestinal:   Negative for abdominal pain, nausea and vomiting.  ? ? ?Physical Exam ?Triage Vital Signs ?ED Triage Vitals  ?Enc Vitals Group  ?   BP 12/11/21 1403 137/71  ?   Pulse Rate 12/11/21 1403 75  ?   Resp 12/11/21 1403 17  ?   Temp 12/11/21 1403 97.6 ?F (36.4 ?C)  ?   Temp Source 12/11/21 1403 Oral  ?   SpO2 12/11/21 1403 98 %  ?   Weight --   ?   Height --   ?   Head Circumference --   ?   Peak Flow --   ?   Pain Score 12/11/21 1404 7  ?   Pain Loc --   ?   Pain Edu? --   ?   Excl. in GC? --   ? ?No data found. ? ?Updated Vital Signs ?BP 137/71 (BP Location: Left Arm)   Pulse 75   Temp 97.6 ?F (36.4 ?C) (Oral)   Resp 17   SpO2 98%  ?   ? ?Physical Exam ?Vitals and nursing note reviewed.  ?Constitutional:   ?   General: He is not in acute distress. ?   Appearance: Normal appearance. He is not ill-appearing.  ?HENT:  ?   Head: Normocephalic and atraumatic.  ?   Nose: Nose  normal. No congestion or rhinorrhea.  ?   Mouth/Throat:  ?   Mouth: Mucous membranes are moist.  ?   Pharynx: Oropharynx is clear. Posterior oropharyngeal erythema present. No oropharyngeal exudate.  ?Eyes:  ?   Conjunctiva/sclera: Conjunctivae normal.  ?Cardiovascular:  ?   Rate and Rhythm: Normal rate.  ?Pulmonary:  ?   Effort: Pulmonary effort is normal.  ?Neurological:  ?   Mental Status: He is alert.  ?Psychiatric:     ?   Mood and Affect: Mood normal.     ?   Behavior: Behavior normal.     ?   Thought Content: Thought content normal.  ? ? ? ?UC Treatments / Results  ?Labs ?(all labs ordered are listed, but only abnormal results are displayed) ?Labs Reviewed  ?POCT RAPID STREP A (OFFICE)  ? ? ?EKG ? ? ?Radiology ?No results found. ? ?Procedures ?Procedures (including critical care time) ? ?Medications Ordered in UC ?Medications - No data to display ? ?Initial Impression / Assessment and Plan / UC Course  ?I have reviewed the triage vital signs and the nursing notes. ? ?Pertinent labs & imaging results that were available during my care of the  patient were reviewed by me and considered in my medical decision making (see chart for details). ? ?  ?Rapid strep negative but given known exposure to strep will treat with amoxicillin. Encouraged follow up with any further concerns.  ? ?Final Clinical Impressions(s) / UC Diagnoses  ? ?Final diagnoses:  ?Acute pharyngitis, unspecified etiology  ?Exposure to strep throat  ? ?Discharge Instructions   ?None ?  ? ?ED Prescriptions   ? ? Medication Sig Dispense Auth. Provider  ? amoxicillin (AMOXIL) 500 MG capsule Take 1 capsule (500 mg total) by mouth 3 (three) times daily. 21 capsule Tomi Bamberger, PA-C  ? ?  ? ?PDMP not reviewed this encounter. ?  ?Tomi Bamberger, PA-C ?12/11/21 1440 ? ?

## 2021-12-11 NOTE — ED Triage Notes (Signed)
Pt presents with sore throat since waking up this morning; significant other recently diagnosed with strep. ?

## 2024-06-02 ENCOUNTER — Ambulatory Visit: Admitting: Nurse Practitioner
